# Patient Record
Sex: Male | Born: 1980
Health system: Southern US, Community
[De-identification: ages and names within clinical notes are randomized; demographics above are authoritative.]

## PROBLEM LIST (undated history)

## (undated) DIAGNOSIS — I4891 Unspecified atrial fibrillation: Secondary | ICD-10-CM

## (undated) DIAGNOSIS — R339 Retention of urine, unspecified: Secondary | ICD-10-CM

## (undated) HISTORY — DX: Unspecified atrial fibrillation: I48.91

## (undated) HISTORY — DX: Retention of urine, unspecified: R33.9

---

## 2003-05-21 ENCOUNTER — Encounter: Admission: RE | Admit: 2003-05-21 | Discharge: 2003-05-21 | Payer: Self-pay | Admitting: Internal Medicine

## 2004-03-16 ENCOUNTER — Encounter: Admission: RE | Admit: 2004-03-16 | Discharge: 2004-03-16 | Payer: Self-pay | Admitting: Internal Medicine

## 2005-08-19 ENCOUNTER — Ambulatory Visit: Payer: Self-pay | Admitting: Internal Medicine

## 2015-09-05 ENCOUNTER — Encounter (HOSPITAL_COMMUNITY): Payer: Self-pay | Admitting: *Deleted

## 2015-09-05 ENCOUNTER — Emergency Department (HOSPITAL_COMMUNITY)
Admission: EM | Admit: 2015-09-05 | Discharge: 2015-09-05 | Disposition: A | Discharge status: Home / Self Care | Payer: 59 | Attending: Emergency Medicine | Admitting: Emergency Medicine

## 2015-09-05 ENCOUNTER — Emergency Department (HOSPITAL_COMMUNITY): Payer: 59

## 2015-09-05 DIAGNOSIS — Y9289 Other specified places as the place of occurrence of the external cause: Secondary | ICD-10-CM | POA: Insufficient documentation

## 2015-09-05 DIAGNOSIS — S61231A Puncture wound without foreign body of left index finger without damage to nail, initial encounter: Secondary | ICD-10-CM | POA: Diagnosis not present

## 2015-09-05 DIAGNOSIS — Y9389 Activity, other specified: Secondary | ICD-10-CM | POA: Insufficient documentation

## 2015-09-05 DIAGNOSIS — W294XXA Contact with nail gun, initial encounter: Secondary | ICD-10-CM | POA: Insufficient documentation

## 2015-09-05 DIAGNOSIS — Y998 Other external cause status: Secondary | ICD-10-CM | POA: Diagnosis not present

## 2015-09-05 DIAGNOSIS — S61239A Puncture wound without foreign body of unspecified finger without damage to nail, initial encounter: Secondary | ICD-10-CM

## 2015-09-05 MED ORDER — IBUPROFEN 600 MG PO TABS: 600.0000 mg | ORAL_TABLET | Freq: Four times a day (QID) | ORAL | Status: DC | PRN

## 2015-09-05 MED ORDER — HYDROCODONE-ACETAMINOPHEN 5-325 MG PO TABS
1.0000 | ORAL_TABLET | Freq: Once | ORAL | Status: AC
  Administered 2015-09-05: 1 via ORAL
  Filled 2015-09-05: qty 1

## 2015-09-05 MED ORDER — CEPHALEXIN 500 MG PO CAPS: 500.0000 mg | ORAL_CAPSULE | Freq: Four times a day (QID) | ORAL | Status: DC

## 2015-09-05 NOTE — Discharge Instructions (Signed)
Ibuprofen or Tylenol for pain. AP finger elevated, ice several times a day. Put topical antibacterial ointment twice a day. Take Keflex as prescribed until all gone to prevent infection. Follow-up with a hand specialist.  Puncture Wound A puncture wound is an injury that is caused by a sharp, thin object that goes through (penetrates) your skin. Usually, a puncture wound does not leave a large opening in your skin, so it may not bleed a lot. However, when you get a puncture wound, dirt or other materials (foreign bodies) can be forced into your wound and break off inside. This increases the chance of infection, such as tetanus. CAUSES Puncture wounds are caused by any sharp, thin object that goes through your skin, such as:  Animal teeth, as with an animal bite.  Sharp, pointed objects, such as nails, splinters of glass, fishhooks, and needles. SYMPTOMS Symptoms of a puncture wound include:  Pain.  Bleeding.  Swelling.  Bruising.  Fluid leaking from the wound.  Numbness, tingling, or loss of function. DIAGNOSIS This condition is diagnosed with a medical history and physical exam. Your wound will be checked to see if it contains any foreign bodies. You may also have X-rays or other imaging tests. TREATMENT Treatment for a puncture wound depends on how serious the wound is. It also depends on whether the wound contains any foreign bodies. Treatment for all types of puncture wounds usually starts with:  Controlling the bleeding.  Washing out the wound with a germ-free (sterile) salt-water solution.  Checking the wound for foreign bodies. Treatment may also include:  Having the wound opened surgically to remove a foreign object.  Closing the wound with stitches (sutures) if it continues to bleed.  Covering the wound with antibiotic ointments and a bandage (dressing).  Receiving a tetanus shot.  Receiving a rabies vaccine. HOME CARE INSTRUCTIONS Medicines  Take or apply  over-the-counter and prescription medicines only as told by your health care provider.  If you were prescribed an antibiotic, take or apply it as told by your health care provider. Do not stop using the antibiotic even if your condition improves. Wound Care  There are many ways to close and cover a wound. For example, a wound can be covered with sutures, skin glue, or adhesive strips. Follow instructions from your health care provider about:  How to take care of your wound.  When and how you should change your dressing.  When you should remove your dressing.  Removing whatever was used to close your wound.  Keep the dressing dry as told by your health care provider. Do not take baths, swim, use a hot tub, or do anything that would put your wound underwater until your health care provider approves.  Clean the wound as told by your health care provider.  Do not scratch or pick at the wound.  Check your wound every day for signs of infection. Watch for:  Redness, swelling, or pain.  Fluid, blood, or pus. General Instructions  Raise (elevate) the injured area above the level of your heart while you are sitting or lying down.  If your puncture wound is in your foot, ask your health care provider if you need to avoid putting weight on your foot and for how long.  Keep all follow-up visits as told by your health care provider. This is important. SEEK MEDICAL CARE IF:  You received a tetanus shot and you have swelling, severe pain, redness, or bleeding at the injection site.  You have  a fever.  Your sutures come out.  You notice a bad smell coming from your wound or your dressing.  You notice something coming out of your wound, such as wood or glass.  Your pain is not controlled with medicine.  You have increased redness, swelling, or pain at the site of your wound.  You have fluid, blood, or pus coming from your wound.  You notice a change in the color of your skin near  your wound.  You need to change the dressing frequently due to fluid, blood, or pus draining from your wound.  You develop a new rash.  You develop numbness around your wound. SEEK IMMEDIATE MEDICAL CARE IF:  You develop severe swelling around your wound.  Your pain suddenly increases and is severe.  You develop painful skin lumps.  You have a red streak going away from your wound.  The wound is on your hand or foot and you cannot properly move a finger or toe.  The wound is on your hand or foot and you notice that your fingers or toes look pale or bluish.   This information is not intended to replace advice given to you by your health care provider. Make sure you discuss any questions you have with your health care provider.   Document Released: 07/27/2005 Document Revised: 07/08/2015 Document Reviewed: 12/10/2014 Elsevier Interactive Patient Education [employer].

## 2015-09-05 NOTE — ED Provider Notes (Signed)
CSN: [id number]     Arrival date & time 09/05/15  1545 History   First MD Initiated Contact with Patient 09/05/15 1611     Chief Complaint  Patient presents with  . Finger Injury     (Consider location/radiation/quality/duration/timing/severity/associated sxs/prior Treatment) HPI Carl Newton is a 34 y.o. male who presents to emergency department complaining of left finger injury. Patient states he put a nail through his left index finger. He states he pulled it out. He states nail went through and through. He denies any numbness or tingling distal to the injury. He denies any difficulty flexing or extending fingers. He states he is having pain in that finger. States pressure was applied for bleeding, and states finger continues to bleed some. States tetanus has been within the last 3 years. No other injuries.  History reviewed. No pertinent past medical history. History reviewed. No pertinent past surgical history. History reviewed. No pertinent family history. Social History  Substance Use Topics  . Smoking status: Never Smoker   . Smokeless tobacco: None  . Alcohol Use: No    Review of Systems  Musculoskeletal: Positive for arthralgias.  Skin: Positive for wound.  Neurological: Negative for weakness and numbness.  All other systems reviewed and are negative.     Allergies  Review of patient's allergies indicates no known allergies.  Home Medications   Prior to Admission medications   Not on File   BP 114/81 mmHg  Pulse 82  Temp(Src) 98 F (36.7 C) (Oral)  Resp 18  Ht 5\' 7"  (1.702 m)  Wt 151 lb (68.493 kg)  BMI 23.64 kg/m2  SpO2 100% Physical Exam  Constitutional: He is oriented to person, place, and time. He appears well-developed and well-nourished. No distress.  HENT:  Head: Normocephalic.  Eyes: Conjunctivae are normal.  Neck: Neck supple.  Musculoskeletal:  Puncture wound through left index finger over the distal phalanx, through and through,  entry wound over the palmar surface and exit wound over the dorsal surface just lateral to the fingernail. Tender to palpation. Full range of motion at all joints, with strength intact with flexion and extension in each joint against resistance  Neurological: He is alert and oriented to person, place, and time.  Nursing note and vitals reviewed.   ED Course  Procedures (including critical care time) Labs Review Labs Reviewed - No data to display  Imaging Review Dg Finger Index Left  09/05/2015  CLINICAL DATA:  Nail gun injury to distal phalanx of left second finger. EXAM: LEFT INDEX FINGER 2+V COMPARISON:  None. FINDINGS: There are clustered tiny densities within the volar soft tissues adjacent to the ulnar aspect of the distal tuft of the distal phalanx in the left second finger, with surrounding soft tissue swelling. Otherwise no focal bony abnormality. No dislocation. IMPRESSION: Clustered tiny densities within the volar soft tissues adjacent to the ulnar aspect of the distal tuft of the distal phalanx in the left second finger with surrounding soft tissue swelling, favor tiny nondisplaced fracture fragments from the distal tuft, cannot exclude tiny radiopaque foreign bodies. Electronically Signed   By: Ilona Sorrel M.D.   On: 09/05/2015 17:15   I have personally reviewed and evaluated these images and lab results as part of my medical decision-making.   EKG Interpretation None      MDM   Final diagnoses:  Puncture wound of finger, initial encounter    patient with puncture wound from a nail. X-ray showing clustered tiny densities adjacent to the  distal tuft of the phalanx, most likely nondisplaced fracture fragments from the nail. Thoroughly irrigated and soaked in saline with iodine. Bacitracin and pressure dressing applied. Home with ice elevation, Keflex, follow-up with hand surgeon as needed.  Filed Vitals:   09/05/15 1604  BP: 114/81  Pulse: 82  Temp: 98 F (36.7 C)   TempSrc: Oral  Resp: 18  Height: 5\' 7"  (1.702 m)  Weight: 151 lb (68.493 kg)  SpO2: 100%     Jeannett Senior, PA-C 09/05/15 2139  Blanchie Dessert, MD 09/06/15 [phone]

## 2015-09-05 NOTE — ED Notes (Signed)
Pt reports shootting nail gun into left index finger. Nail removed pta.

## 2015-11-26 DIAGNOSIS — Z0000 Encounter for general adult medical examination without abnormal findings: Secondary | ICD-10-CM | POA: Diagnosis not present

## 2015-12-01 DIAGNOSIS — Z0000 Encounter for general adult medical examination without abnormal findings: Secondary | ICD-10-CM | POA: Diagnosis not present

## 2016-02-23 DIAGNOSIS — H52222 Regular astigmatism, left eye: Secondary | ICD-10-CM | POA: Diagnosis not present

## 2016-02-23 DIAGNOSIS — H5213 Myopia, bilateral: Secondary | ICD-10-CM | POA: Diagnosis not present

## 2016-06-14 DIAGNOSIS — N399 Disorder of urinary system, unspecified: Secondary | ICD-10-CM | POA: Diagnosis not present

## 2016-08-12 DIAGNOSIS — R3912 Poor urinary stream: Secondary | ICD-10-CM | POA: Diagnosis not present

## 2016-08-12 DIAGNOSIS — R3911 Hesitancy of micturition: Secondary | ICD-10-CM | POA: Diagnosis not present

## 2016-08-12 DIAGNOSIS — R278 Other lack of coordination: Secondary | ICD-10-CM | POA: Diagnosis not present

## 2016-08-16 MED FILL — TAMSULOSIN HCL 0.4 MG CAP: 0.4 | 30 days supply | Qty: 30 | Fill #0

## 2016-10-11 DIAGNOSIS — R3911 Hesitancy of micturition: Secondary | ICD-10-CM | POA: Diagnosis not present

## 2016-11-23 DIAGNOSIS — R278 Other lack of coordination: Secondary | ICD-10-CM | POA: Diagnosis not present

## 2016-11-23 DIAGNOSIS — M6281 Muscle weakness (generalized): Secondary | ICD-10-CM | POA: Diagnosis not present

## 2016-11-23 DIAGNOSIS — R3912 Poor urinary stream: Secondary | ICD-10-CM | POA: Diagnosis not present

## 2016-11-23 DIAGNOSIS — R3911 Hesitancy of micturition: Secondary | ICD-10-CM | POA: Diagnosis not present

## 2016-11-23 DIAGNOSIS — M545 Low back pain: Secondary | ICD-10-CM | POA: Diagnosis not present

## 2016-11-23 DIAGNOSIS — M62838 Other muscle spasm: Secondary | ICD-10-CM | POA: Diagnosis not present

## 2016-12-01 DIAGNOSIS — R3912 Poor urinary stream: Secondary | ICD-10-CM | POA: Diagnosis not present

## 2016-12-01 DIAGNOSIS — M545 Low back pain: Secondary | ICD-10-CM | POA: Diagnosis not present

## 2016-12-01 DIAGNOSIS — M62838 Other muscle spasm: Secondary | ICD-10-CM | POA: Diagnosis not present

## 2016-12-01 DIAGNOSIS — R3911 Hesitancy of micturition: Secondary | ICD-10-CM | POA: Diagnosis not present

## 2016-12-01 DIAGNOSIS — M6281 Muscle weakness (generalized): Secondary | ICD-10-CM | POA: Diagnosis not present

## 2016-12-14 DIAGNOSIS — M545 Low back pain: Secondary | ICD-10-CM | POA: Diagnosis not present

## 2016-12-14 DIAGNOSIS — M62838 Other muscle spasm: Secondary | ICD-10-CM | POA: Diagnosis not present

## 2016-12-14 DIAGNOSIS — R3911 Hesitancy of micturition: Secondary | ICD-10-CM | POA: Diagnosis not present

## 2016-12-14 DIAGNOSIS — N3943 Post-void dribbling: Secondary | ICD-10-CM | POA: Diagnosis not present

## 2016-12-14 DIAGNOSIS — M6281 Muscle weakness (generalized): Secondary | ICD-10-CM | POA: Diagnosis not present

## 2016-12-28 DIAGNOSIS — R3911 Hesitancy of micturition: Secondary | ICD-10-CM | POA: Diagnosis not present

## 2016-12-28 DIAGNOSIS — M62838 Other muscle spasm: Secondary | ICD-10-CM | POA: Diagnosis not present

## 2016-12-28 DIAGNOSIS — M6281 Muscle weakness (generalized): Secondary | ICD-10-CM | POA: Diagnosis not present

## 2016-12-28 DIAGNOSIS — N3943 Post-void dribbling: Secondary | ICD-10-CM | POA: Diagnosis not present

## 2016-12-28 DIAGNOSIS — R3912 Poor urinary stream: Secondary | ICD-10-CM | POA: Diagnosis not present

## 2017-01-11 DIAGNOSIS — R3912 Poor urinary stream: Secondary | ICD-10-CM | POA: Diagnosis not present

## 2017-01-11 DIAGNOSIS — R3911 Hesitancy of micturition: Secondary | ICD-10-CM | POA: Diagnosis not present

## 2017-01-13 DIAGNOSIS — R3911 Hesitancy of micturition: Secondary | ICD-10-CM | POA: Diagnosis not present

## 2017-01-13 DIAGNOSIS — N3943 Post-void dribbling: Secondary | ICD-10-CM | POA: Diagnosis not present

## 2017-01-13 DIAGNOSIS — R3912 Poor urinary stream: Secondary | ICD-10-CM | POA: Diagnosis not present

## 2017-01-13 DIAGNOSIS — M62838 Other muscle spasm: Secondary | ICD-10-CM | POA: Diagnosis not present

## 2017-01-13 DIAGNOSIS — M6281 Muscle weakness (generalized): Secondary | ICD-10-CM | POA: Diagnosis not present

## 2017-01-25 DIAGNOSIS — M6281 Muscle weakness (generalized): Secondary | ICD-10-CM | POA: Diagnosis not present

## 2017-01-25 DIAGNOSIS — M62838 Other muscle spasm: Secondary | ICD-10-CM | POA: Diagnosis not present

## 2017-01-25 DIAGNOSIS — N3943 Post-void dribbling: Secondary | ICD-10-CM | POA: Diagnosis not present

## 2017-01-25 DIAGNOSIS — R3912 Poor urinary stream: Secondary | ICD-10-CM | POA: Diagnosis not present

## 2017-01-25 DIAGNOSIS — M545 Low back pain: Secondary | ICD-10-CM | POA: Diagnosis not present

## 2017-01-25 DIAGNOSIS — R3911 Hesitancy of micturition: Secondary | ICD-10-CM | POA: Diagnosis not present

## 2017-02-07 MED FILL — TAMSULOSIN HCL 0.4 MG CAP: 0.4 | 30 days supply | Qty: 30 | Fill #1

## 2017-02-08 DIAGNOSIS — M545 Low back pain: Secondary | ICD-10-CM | POA: Diagnosis not present

## 2017-02-08 DIAGNOSIS — M6281 Muscle weakness (generalized): Secondary | ICD-10-CM | POA: Diagnosis not present

## 2017-02-08 DIAGNOSIS — N3943 Post-void dribbling: Secondary | ICD-10-CM | POA: Diagnosis not present

## 2017-02-08 DIAGNOSIS — R3912 Poor urinary stream: Secondary | ICD-10-CM | POA: Diagnosis not present

## 2017-02-08 DIAGNOSIS — R3911 Hesitancy of micturition: Secondary | ICD-10-CM | POA: Diagnosis not present

## 2017-02-08 DIAGNOSIS — M62838 Other muscle spasm: Secondary | ICD-10-CM | POA: Diagnosis not present

## 2017-04-20 MED FILL — TAMSULOSIN HCL 0.4 MG CAP: 0.4 | 90 days supply | Qty: 90 | Fill #2

## 2017-04-25 DIAGNOSIS — R3912 Poor urinary stream: Secondary | ICD-10-CM | POA: Diagnosis not present

## 2017-04-25 DIAGNOSIS — R3911 Hesitancy of micturition: Secondary | ICD-10-CM | POA: Diagnosis not present

## 2017-09-18 ENCOUNTER — Encounter: Payer: Self-pay | Admitting: Family Medicine

## 2017-09-18 ENCOUNTER — Ambulatory Visit (INDEPENDENT_AMBULATORY_CARE_PROVIDER_SITE_OTHER): Payer: 59 | Admitting: Family Medicine

## 2017-09-18 VITALS — BP 114/60 | HR 62 | Temp 98.0°F | Wt 150.0 lb

## 2017-09-18 DIAGNOSIS — Z0000 Encounter for general adult medical examination without abnormal findings: Secondary | ICD-10-CM | POA: Diagnosis not present

## 2017-09-18 DIAGNOSIS — Z7689 Persons encountering health services in other specified circumstances: Secondary | ICD-10-CM

## 2017-09-18 NOTE — Progress Notes (Signed)
Subjective:    Patient ID: Carl Newton, male    DOB: [birth year], 36 y.o.   MRN: [id number]  HPI  Patient is a very pleasant 36 year old Caucasian male here today to establish care.  He has no specific concerns today.  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  They are currently managing him with Flomax.  He is seen very little benefit from the Flomax and is interested in other options for treatment.  He reports that for several years, whenever he tries to urinate, he will have to wait 30-60 seconds before urine will start to flow.  At times is even worse.  He denies any dysuria or hematuria or urgency.  He denies any incontinence.  Doctor has checked his prostate.  Otherwise he has no concerns.  Flu shot is up-to-date Past Medical History:  Diagnosis Date  . Urinary retention    No past surgical history on file. Med list includes Flomax 0.4 mg p.o. nightly and Claritin 10 mg p.o. Daily No Known Allergies Social History   Socioeconomic History  . Marital status: Married    Spouse name: Not on file  . Number of children: Not on file  . Years of education: Not on file  . Highest education level: Not on file  Social Needs  . Financial resource strain: Not on file  . Food insecurity - worry: Not on file  . Food insecurity - inability: Not on file  . Transportation needs - medical: Not on file  . Transportation needs - non-medical: Not on file  Occupational History  . Not on file  Tobacco Use  . Smoking status: Never Smoker  Substance and Sexual Activity  . Alcohol use: No  . Drug use: No  . Sexual activity: Not on file  Other Topics Concern  . Not on file  Social History Narrative  . Not on file   Patient is married with 2 children.  He works as a Software engineer. Family History  Problem Relation Age of Onset  . Mental illness Father    . Cancer Father        prostate  . Heart disease Maternal Uncle   . Heart disease Maternal Grandmother   . Heart disease Maternal Grandfather      Review of Systems  All other systems reviewed and are negative.      Objective:   Physical Exam  Constitutional: He is oriented to person, place, and time. He appears well-developed and well-nourished. No distress.  HENT:  Head: Normocephalic and atraumatic.  Right Ear: External ear normal.  Left Ear: External ear normal.  Nose: Nose normal.  Mouth/Throat: Oropharynx is clear and moist. No oropharyngeal exudate.  Eyes: Conjunctivae and EOM are normal. Pupils are equal, round, and reactive to light. Right eye exhibits no discharge. Left eye exhibits no discharge.  Neck: Normal range of motion. Neck supple. No JVD present. No tracheal deviation present. No thyromegaly present.  Cardiovascular: Normal rate, regular rhythm, normal heart sounds and intact distal pulses. Exam reveals no gallop and no friction rub.  No murmur heard. Pulmonary/Chest: Effort normal and breath sounds normal. No stridor. No respiratory distress. He has no wheezes. He has no rales. He exhibits no tenderness.  Abdominal: Soft. Bowel sounds are normal. He exhibits no distension and no mass. There is no tenderness. There is no rebound and no  guarding.  Musculoskeletal: Normal range of motion. He exhibits no edema, tenderness or deformity.  Lymphadenopathy:    He has no cervical adenopathy.  Neurological: He is alert and oriented to person, place, and time. He has normal reflexes. He displays normal reflexes. No cranial nerve deficit. He exhibits normal muscle tone. Coordination normal.  Skin: Skin is warm. No rash noted. He is not diaphoretic. No erythema. No pallor.  Psychiatric: He has a normal mood and affect. His behavior is normal. Judgment and thought content normal.  Vitals reviewed.         Assessment & Plan:  Encounter to establish care with new  doctor  Physical exam today is completely normal.  I recommended a flu shot for the patient's flu shot is up-to-date.  Cancer screening is not yet due.  Given his family history of premature cardiovascular disease in his maternal uncle and in his maternal grandmother, I have recommended a lipomed at his next fasting lab work.

## 2017-10-30 DIAGNOSIS — H5213 Myopia, bilateral: Secondary | ICD-10-CM | POA: Diagnosis not present

## 2017-10-30 DIAGNOSIS — H52222 Regular astigmatism, left eye: Secondary | ICD-10-CM | POA: Diagnosis not present

## 2017-11-15 ENCOUNTER — Other Ambulatory Visit: Payer: No Typology Code available for payment source

## 2017-11-15 DIAGNOSIS — Z0000 Encounter for general adult medical examination without abnormal findings: Secondary | ICD-10-CM

## 2017-11-16 NOTE — Addendum Note (Signed)
Addended by: Jenna Luo T on: 11/16/2017 06:43 AM   Modules accepted: Level of Service

## 2017-11-22 LAB — CARDIO IQ(R) ADVANCED LIPID PANEL
APOLIPOPROTEIN (CARDIO IQ ADV LIPID PANEL): 91 mg/dL (ref 52–109)
CHOL/HDL RATIO: 2.7 calc (ref <5.0)
CHOLESTEROL: 211 mg/dL — AB (ref <200)
HDL: 78 mg/dL (ref >40)
LDL Cholesterol (Calc): 117 mg/dL — ABNORMAL HIGH (ref <100)
LDL LARGE: 6720 nmol/L (ref 3382–9376)
LDL Medium: 260 nmol/L (ref 122–498)
LDL PEAK SIZE: 226.1 Angstrom (ref >=217)
LDL Particle Number: 1524 nmol/L (ref 732–2035)
LDL Small: 184 nmol/L (ref 85–473)
NON-HDL CHOLESTEROL (CALC): 133 mg/dL — AB (ref <130)
TRIGLYCERIDES: 67 mg/dL (ref <150)

## 2017-11-22 LAB — CBC WITH DIFFERENTIAL/PLATELET
BASOS PCT: 0.4 %
Basophils Absolute: 22 cells/uL (ref 0–200)
EOS ABS: 94 {cells}/uL (ref 15–500)
Eosinophils Relative: 1.7 %
HEMATOCRIT: 43 % (ref 38.5–50.0)
Hemoglobin: 14.7 g/dL (ref 13.2–17.1)
LYMPHS ABS: 1859 {cells}/uL (ref 850–3900)
MCH: 29.7 pg (ref 27.0–33.0)
MCHC: 34.2 g/dL (ref 32.0–36.0)
MCV: 86.9 fL (ref 80.0–100.0)
MPV: 9.5 fL (ref 7.5–12.5)
Monocytes Relative: 7.9 %
Neutro Abs: 3091 cells/uL (ref 1500–7800)
Neutrophils Relative %: 56.2 %
Platelets: 230 10*3/uL (ref 140–400)
RBC: 4.95 10*6/uL (ref 4.20–5.80)
RDW: 12.3 % (ref 11.0–15.0)
Total Lymphocyte: 33.8 %
WBC: 5.5 10*3/uL (ref 3.8–10.8)
WBCMIX: 435 {cells}/uL (ref 200–950)

## 2017-11-22 LAB — COMPREHENSIVE METABOLIC PANEL
AG RATIO: 1.5 (calc) (ref 1.0–2.5)
ALKALINE PHOSPHATASE (APISO): 57 U/L (ref 40–115)
ALT: 16 U/L (ref 9–46)
AST: 16 U/L (ref 10–40)
Albumin: 4.4 g/dL (ref 3.6–5.1)
BUN: 17 mg/dL (ref 7–25)
CHLORIDE: 102 mmol/L (ref 98–110)
CO2: 25 mmol/L (ref 20–32)
Calcium: 9.4 mg/dL (ref 8.6–10.3)
Creat: 1.11 mg/dL (ref 0.60–1.35)
GLOBULIN: 3 g/dL (ref 1.9–3.7)
Glucose, Bld: 85 mg/dL (ref 65–99)
Potassium: 4.6 mmol/L (ref 3.5–5.3)
Sodium: 138 mmol/L (ref 135–146)
Total Bilirubin: 0.4 mg/dL (ref 0.2–1.2)
Total Protein: 7.4 g/dL (ref 6.1–8.1)

## 2017-11-24 ENCOUNTER — Encounter: Payer: Self-pay | Admitting: Family Medicine

## 2017-12-01 MED FILL — TAMSULOSIN HCL 0.4 MG CAP: 0.4 | 90 days supply | Qty: 180 | Fill #0

## 2018-05-22 MED FILL — CLINDAMYCIN HCL 150 MG CAPS: 150 | 7 days supply | Qty: 28 | Fill #0

## 2018-05-22 MED FILL — HYDROCODON-APAP 5-325: 5-325 | 4 days supply | Qty: 20 | Fill #0

## 2018-06-04 MED FILL — ETODOLAC 400 MG TABLET: 400 | 4 days supply | Qty: 12 | Fill #0

## 2019-03-07 ENCOUNTER — Ambulatory Visit (INDEPENDENT_AMBULATORY_CARE_PROVIDER_SITE_OTHER): Payer: No Typology Code available for payment source | Admitting: Family Medicine

## 2019-03-07 ENCOUNTER — Encounter: Payer: Self-pay | Admitting: Family Medicine

## 2019-03-07 ENCOUNTER — Other Ambulatory Visit: Payer: Self-pay

## 2019-03-07 VITALS — BP 112/70 | HR 68 | Temp 98.4°F | Resp 14 | Ht 68.0 in | Wt 160.0 lb

## 2019-03-07 DIAGNOSIS — R339 Retention of urine, unspecified: Secondary | ICD-10-CM | POA: Diagnosis not present

## 2019-03-07 DIAGNOSIS — R109 Unspecified abdominal pain: Secondary | ICD-10-CM

## 2019-03-07 NOTE — Progress Notes (Signed)
Subjective:    Patient ID: Carl Newton, male    DOB: 03-29-1981, 38 y.o.   MRN: [id number]  HPI Over the last week, the patient reports a "twitching" sensation in his abdomen just to the left and inferior to his umbilicus.  There is no exacerbating or alleviating event.  It is a pressure-like pain that will come and go for a split second.  It feels like a twitching sensation similar to a muscle fasciculation.  Is not brought on by movement such as flexion or extension or twisting.  Is not brought on by food.  He has not been constipated.  He denies any diarrhea.  He denies any melena or hematochezia.  He denies any hematuria or dysuria.  He denies any radiation of the pain into his back or into his testicles.  He denies any fevers or chills or nausea or vomiting.  He has not had the pain anymore today although it did occur quite regularly for the last 2 or 3 days prior.  He is also requesting a repeat referral to a urologist for his urinary retention.  He previously saw [facility] in the past and would like to see them again as a second opinion. Past Medical History:  Diagnosis Date  . Urinary retention    No past surgical history on file. Current Outpatient Medications on File Prior to Visit  Medication Sig Dispense Refill  . cephALEXin (KEFLEX) 500 MG capsule Take 1 capsule (500 mg total) by mouth 4 (four) times daily. 20 capsule 0  . ibuprofen (ADVIL,MOTRIN) 600 MG tablet Take 1 tablet (600 mg total) by mouth every 6 (six) hours as needed. 30 tablet 0   No current facility-administered medications on file prior to visit.    Social History   Socioeconomic History  . Marital status: Married    Spouse name: Not on file  . Number of children: Not on file  . Years of education: Not on file  . Highest education level: Not on file  Occupational History  . Not on file  Social Needs  . Financial resource strain: Not on file  . Food insecurity:    Worry: Not on file   Inability: Not on file  . Transportation needs:    Medical: Not on file    Non-medical: Not on file  Tobacco Use  . Smoking status: Never Smoker  Substance and Sexual Activity  . Alcohol use: No  . Drug use: No  . Sexual activity: Not on file  Lifestyle  . Physical activity:    Days per week: Not on file    Minutes per session: Not on file  . Stress: Not on file  Relationships  . Social connections:    Talks on phone: Not on file    Gets together: Not on file    Attends religious service: Not on file    Active member of club or organization: Not on file    Attends meetings of clubs or organizations: Not on file    Relationship status: Not on file  . Intimate partner violence:    Fear of current or ex partner: Not on file    Emotionally abused: Not on file    Physically abused: Not on file    Forced sexual activity: Not on file  Other Topics Concern  . Not on file  Social History Narrative  . Not on file   Family History  Problem Relation Age of Onset  . Mental illness Father   .  Cancer Father        prostate  . Heart disease Maternal Uncle   . Heart disease Maternal Grandmother   . Heart disease Maternal Grandfather       Review of Systems     Objective:   Physical Exam Constitutional:      General: He is not in acute distress.    Appearance: Normal appearance. He is normal weight. He is not ill-appearing, toxic-appearing or diaphoretic.  Cardiovascular:     Rate and Rhythm: Normal rate and regular rhythm.     Pulses: Normal pulses.     Heart sounds: Normal heart sounds.  Pulmonary:     Effort: Pulmonary effort is normal. No respiratory distress.     Breath sounds: Normal breath sounds. No stridor. No wheezing, rhonchi or rales.  Chest:     Chest wall: No tenderness.  Abdominal:     General: Bowel sounds are normal. There is no distension.     Palpations: Abdomen is soft. There is no mass.     Tenderness: There is no abdominal tenderness. There is no  right CVA tenderness, left CVA tenderness, guarding or rebound.     Hernia: No hernia is present.  Neurological:     Mental Status: He is alert.           Assessment & Plan:  Abdominal spasm I explained to the patient that I am uncertain as to the cause of his symptoms.  I do not see any evidence of a serious life-threatening illness or injury that is causing his symptoms.  His differential diagnosis includes muscle fasciculations in the deep abdominal muscle wall, faint intestinal spasms, or possibly small nephrolithiasis passing into the bladder.  At the present time his symptoms have improved and have not been present today.  Therefore I recommended continued monitoring as I anticipate this is most likely muscular and self-limited and will resolve on its own over the next few days.  I will consult urology regarding his history of urinary retention and request Dr. McDiarmid.

## 2019-06-06 ENCOUNTER — Other Ambulatory Visit: Payer: Self-pay | Admitting: Family Medicine

## 2019-06-06 DIAGNOSIS — Z0000 Encounter for general adult medical examination without abnormal findings: Secondary | ICD-10-CM

## 2019-06-10 ENCOUNTER — Other Ambulatory Visit: Payer: Self-pay

## 2019-06-10 ENCOUNTER — Other Ambulatory Visit: Payer: No Typology Code available for payment source

## 2019-06-10 DIAGNOSIS — Z0000 Encounter for general adult medical examination without abnormal findings: Secondary | ICD-10-CM

## 2019-06-11 LAB — CBC WITH DIFFERENTIAL/PLATELET
Absolute Monocytes: 473 cells/uL (ref 200–950)
Basophils Absolute: 22 cells/uL (ref 0–200)
Basophils Relative: 0.4 %
Eosinophils Absolute: 171 cells/uL (ref 15–500)
Eosinophils Relative: 3.1 %
HCT: 40.2 % (ref 38.5–50.0)
Hemoglobin: 13.8 g/dL (ref 13.2–17.1)
Lymphs Abs: 2497 cells/uL (ref 850–3900)
MCH: 30.9 pg (ref 27.0–33.0)
MCHC: 34.3 g/dL (ref 32.0–36.0)
MCV: 89.9 fL (ref 80.0–100.0)
MPV: 9.9 fL (ref 7.5–12.5)
Monocytes Relative: 8.6 %
Neutro Abs: 2338 cells/uL (ref 1500–7800)
Neutrophils Relative %: 42.5 %
Platelets: 251 10*3/uL (ref 140–400)
RBC: 4.47 10*6/uL (ref 4.20–5.80)
RDW: 12.4 % (ref 11.0–15.0)
Total Lymphocyte: 45.4 %
WBC: 5.5 10*3/uL (ref 3.8–10.8)

## 2019-06-11 LAB — COMPREHENSIVE METABOLIC PANEL
AG Ratio: 1.4 (calc) (ref 1.0–2.5)
ALT: 13 U/L (ref 9–46)
AST: 21 U/L (ref 10–40)
Albumin: 4.2 g/dL (ref 3.6–5.1)
Alkaline phosphatase (APISO): 63 U/L (ref 36–130)
BUN: 21 mg/dL (ref 7–25)
CO2: 26 mmol/L (ref 20–32)
Calcium: 9.3 mg/dL (ref 8.6–10.3)
Chloride: 104 mmol/L (ref 98–110)
Creat: 1.16 mg/dL (ref 0.60–1.35)
Globulin: 3 g/dL (calc) (ref 1.9–3.7)
Glucose, Bld: 86 mg/dL (ref 65–99)
Potassium: 4.3 mmol/L (ref 3.5–5.3)
Sodium: 138 mmol/L (ref 135–146)
Total Bilirubin: 0.6 mg/dL (ref 0.2–1.2)
Total Protein: 7.2 g/dL (ref 6.1–8.1)

## 2019-06-11 LAB — LIPID PANEL
Cholesterol: 193 mg/dL (ref <200)
HDL: 58 mg/dL (ref >=40)
LDL Cholesterol (Calc): 118 mg/dL (calc) — ABNORMAL HIGH
Non-HDL Cholesterol (Calc): 135 mg/dL (calc) — ABNORMAL HIGH (ref <130)
Total CHOL/HDL Ratio: 3.3 (calc) (ref <5.0)
Triglycerides: 74 mg/dL (ref <150)

## 2019-06-14 ENCOUNTER — Other Ambulatory Visit: Payer: Self-pay

## 2019-06-17 ENCOUNTER — Ambulatory Visit (INDEPENDENT_AMBULATORY_CARE_PROVIDER_SITE_OTHER): Payer: No Typology Code available for payment source | Admitting: Family Medicine

## 2019-06-17 ENCOUNTER — Encounter: Payer: Self-pay | Admitting: Family Medicine

## 2019-06-17 ENCOUNTER — Other Ambulatory Visit: Payer: Self-pay

## 2019-06-17 VITALS — BP 130/84 | HR 62 | Temp 98.6°F | Resp 16 | Ht 68.0 in | Wt 162.0 lb

## 2019-06-17 DIAGNOSIS — Z0000 Encounter for general adult medical examination without abnormal findings: Secondary | ICD-10-CM

## 2019-06-17 NOTE — Progress Notes (Signed)
Subjective:    Patient ID: Carl Newton, male    DOB: [birth year], 38 y.o.   MRN: [id number]  HPI  Patient is a very pleasant 38 year old Caucasian male here today for complete physical exam.  Unfortunately since I last saw the patient, his father passed away.  His father was diagnosed with Alzheimer's disease and his dementia rapidly progressed to the point he was placed in the skilled nursing facility.  He contracted COVID while there and was ultimately in the hospital when he passed away.  The patient was unable to visit his father.  He is still grieving his father's loss as his father passed away less than 2 weeks ago.  He is concerned because Alzheimer's disease is strongly his father side of the family including his father, his uncle, his aunt.  All acquired memory problems in their 37s.  He continues to deal with urinary hesitancy particularly in public as he is unable to relax his bladder and it takes him 30 seconds or more to initiate a stream of urine.  He also feels that he has a weak urinary stream.  He is concerned that this may be a sign of MS. Past Medical History:  Diagnosis Date  . Urinary retention    No past surgical history on file.  No Known Allergies Social History   Socioeconomic History  . Marital status: Married    Spouse name: Not on file  . Number of children: Not on file  . Years of education: Not on file  . Highest education level: Not on file  Occupational History  . Not on file  Social Needs  . Financial resource strain: Not on file  . Food insecurity    Worry: Not on file    Inability: Not on file  . Transportation needs    Medical: Not on file    Non-medical: Not on file  Tobacco Use  . Smoking status: Never Smoker  . Smokeless tobacco: Never Used  Substance and Sexual Activity  . Alcohol use: No  . Drug use: No  . Sexual activity: Not on file  Lifestyle  . Physical activity    Days per week: Not on file    Minutes per session: Not  on file  . Stress: Not on file  Relationships  . Social Herbalist on phone: Not on file    Gets together: Not on file    Attends religious service: Not on file    Active member of club or organization: Not on file    Attends meetings of clubs or organizations: Not on file    Relationship status: Not on file  . Intimate partner violence    Fear of current or ex partner: Not on file    Emotionally abused: Not on file    Physically abused: Not on file    Forced sexual activity: Not on file  Other Topics Concern  . Not on file  Social History Narrative  . Not on file   Patient is married with 2 children.  He works as a Software engineer. Family History  Problem Relation Age of Onset  . Mental illness Father   . Cancer Father        prostate  . Heart disease Maternal Uncle   . Heart disease Maternal Grandmother   . Heart disease Maternal Grandfather      Review of Systems  All other systems reviewed and are negative.      Objective:  Physical Exam  Constitutional: He is oriented to person, place, and time. He appears well-developed and well-nourished. No distress.  HENT:  Head: Normocephalic and atraumatic.  Right Ear: External ear normal.  Left Ear: External ear normal.  Nose: Nose normal.  Mouth/Throat: Oropharynx is clear and moist. No oropharyngeal exudate.  Eyes: Pupils are equal, round, and reactive to light. Conjunctivae and EOM are normal. Right eye exhibits no discharge. Left eye exhibits no discharge.  Neck: Normal range of motion. Neck supple. No JVD present. No tracheal deviation present. No thyromegaly present.  Cardiovascular: Normal rate, regular rhythm, normal heart sounds and intact distal pulses. Exam reveals no gallop and no friction rub.  No murmur heard. Pulmonary/Chest: Effort normal and breath sounds normal. No stridor. No respiratory distress. He has no wheezes. He has no rales. He exhibits no tenderness.  Abdominal: Soft. Bowel sounds are  normal. He exhibits no distension and no mass. There is no abdominal tenderness. There is no rebound and no guarding.  Musculoskeletal: Normal range of motion.        General: No tenderness, deformity or edema.  Lymphadenopathy:    He has no cervical adenopathy.  Neurological: He is alert and oriented to person, place, and time. He has normal reflexes. No cranial nerve deficit. He exhibits normal muscle tone. Coordination normal.  Skin: Skin is warm. No rash noted. He is not diaphoretic. No erythema. No pallor.  Psychiatric: He has a normal mood and affect. His behavior is normal. Judgment and thought content normal.  Vitals reviewed.         Assessment & Plan:  The encounter diagnosis was General medical exam. Physical exam today is completely normal.  We discussed his urinary retention.  I question if the patient has some type of neurogenic bladder.  I recommended that he discuss with his urologist potentially trying bethanechol.  However I explained to the patient that this is well outside my experience.  Another option would be to consult neurology however the symptoms seem mild at the present time I believe the patient is willing simply to live with him.  I offered my condolences regarding the passing of his father.  His lab work was obtained which is completely normal and listed below: Appointment on 06/10/2019  Component Date Value Ref Range Status  . Cholesterol 06/10/2019 193  <200 mg/dL Final  . HDL 06/10/2019 58  > OR = 40 mg/dL Final  . Triglycerides 06/10/2019 74  <150 mg/dL Final  . LDL Cholesterol (Calc) 06/10/2019 118* mg/dL (calc) Final   Comment: Reference range: <100 . Desirable range <100 mg/dL for primary prevention;   <70 mg/dL for patients with CHD or diabetic patients  with > or = 2 CHD risk factors. Marland Kitchen LDL-C is now calculated using the Martin-Hopkins  calculation, which is a validated novel method providing  better accuracy than the Friedewald equation in the   estimation of LDL-C.  Cresenciano Genre et al. Annamaria Helling. 3149;702(63): 2061-2068  (http://education.[facility].com/faq/FAQ164)   . Total CHOL/HDL Ratio 06/10/2019 3.3  <5.0 (calc) Final  . Non-HDL Cholesterol (Calc) 06/10/2019 135* <130 mg/dL (calc) Final   Comment: For patients with diabetes plus 1 major ASCVD risk  factor, treating to a non-HDL-C goal of <100 mg/dL  (LDL-C of <70 mg/dL) is considered a therapeutic  option.   . Glucose, Bld 06/10/2019 86  65 - 99 mg/dL Final   Comment: .            Fasting reference interval .   .  BUN 06/10/2019 21  7 - 25 mg/dL Final  . Creat 06/10/2019 1.16  0.60 - 1.35 mg/dL Final  . BUN/Creatinine Ratio 05/69/7948 NOT APPLICABLE  6 - 22 (calc) Final  . Sodium 06/10/2019 138  135 - 146 mmol/L Final  . Potassium 06/10/2019 4.3  3.5 - 5.3 mmol/L Final  . Chloride 06/10/2019 104  98 - 110 mmol/L Final  . CO2 06/10/2019 26  20 - 32 mmol/L Final  . Calcium 06/10/2019 9.3  8.6 - 10.3 mg/dL Final  . Total Protein 06/10/2019 7.2  6.1 - 8.1 g/dL Final  . Albumin 06/10/2019 4.2  3.6 - 5.1 g/dL Final  . Globulin 06/10/2019 3.0  1.9 - 3.7 g/dL (calc) Final  . AG Ratio 06/10/2019 1.4  1.0 - 2.5 (calc) Final  . Total Bilirubin 06/10/2019 0.6  0.2 - 1.2 mg/dL Final  . Alkaline phosphatase (APISO) 06/10/2019 63  36 - 130 U/L Final  . AST 06/10/2019 21  10 - 40 U/L Final  . ALT 06/10/2019 13  9 - 46 U/L Final  . WBC 06/10/2019 5.5  3.8 - 10.8 Thousand/uL Final  . RBC 06/10/2019 4.47  4.20 - 5.80 Million/uL Final  . Hemoglobin 06/10/2019 13.8  13.2 - 17.1 g/dL Final  . HCT 06/10/2019 40.2  38.5 - 50.0 % Final  . MCV 06/10/2019 89.9  80.0 - 100.0 fL Final  . MCH 06/10/2019 30.9  27.0 - 33.0 pg Final  . MCHC 06/10/2019 34.3  32.0 - 36.0 g/dL Final  . RDW 06/10/2019 12.4  11.0 - 15.0 % Final  . Platelets 06/10/2019 251  140 - 400 Thousand/uL Final  . MPV 06/10/2019 9.9  7.5 - 12.5 fL Final  . Neutro Abs 06/10/2019 2,338  1,500 - 7,800 cells/uL Final  . Lymphs  Abs 06/10/2019 2,497  850 - 3,900 cells/uL Final  . Absolute Monocytes 06/10/2019 473  200 - 950 cells/uL Final  . Eosinophils Absolute 06/10/2019 171  15 - 500 cells/uL Final  . Basophils Absolute 06/10/2019 22  0 - 200 cells/uL Final  . Neutrophils Relative % 06/10/2019 42.5  % Final  . Total Lymphocyte 06/10/2019 45.4  % Final  . Monocytes Relative 06/10/2019 8.6  % Final  . Eosinophils Relative 06/10/2019 3.1  % Final  . Basophils Relative 06/10/2019 0.4  % Final

## 2019-06-28 MED FILL — ALFUZOSIN HCL ER 10 MG TAB: 10 | 30 days supply | Qty: 30 | Fill #0

## 2020-07-20 ENCOUNTER — Other Ambulatory Visit: Payer: Self-pay

## 2020-07-20 ENCOUNTER — Ambulatory Visit (INDEPENDENT_AMBULATORY_CARE_PROVIDER_SITE_OTHER): Payer: No Typology Code available for payment source | Admitting: Family Medicine

## 2020-07-20 ENCOUNTER — Encounter: Payer: Self-pay | Admitting: Family Medicine

## 2020-07-20 VITALS — BP 128/88 | HR 74 | Temp 97.6°F | Ht 67.0 in | Wt 152.0 lb

## 2020-07-20 DIAGNOSIS — Z0000 Encounter for general adult medical examination without abnormal findings: Secondary | ICD-10-CM | POA: Diagnosis not present

## 2020-07-20 NOTE — Progress Notes (Signed)
Subjective:    Patient ID: Carl Newton, male    DOB: [birth year], 39 y.o.   MRN: [id number]  HPI  Patient is a very pleasant 39 year old Caucasian male here today for complete physical exam.  Patient denies any medical concerns.  He is working mainly from home.  He occasionally goes into the office for projects.  He has 3 children.  They are all involved in a daycare at their local church.  He has received his Covid vaccination.  He is due for his flu shot but he would like to wait and get that in October at his job.  Otherwise he is doing well with no concerns Past Medical History:  Diagnosis Date  . Urinary retention    No past surgical history on file.  No Known Allergies Social History   Socioeconomic History  . Marital status: Married    Spouse name: Not on file  . Number of children: Not on file  . Years of education: Not on file  . Highest education level: Not on file  Occupational History  . Not on file  Tobacco Use  . Smoking status: Never Smoker  . Smokeless tobacco: Never Used  Substance and Sexual Activity  . Alcohol use: No  . Drug use: No  . Sexual activity: Not on file  Other Topics Concern  . Not on file  Social History Narrative  . Not on file   Social Determinants of Health   Financial Resource Strain:   . Difficulty of Paying Living Expenses: Not on file  Food Insecurity:   . Worried About Charity fundraiser in the Last Year: Not on file  . Ran Out of Food in the Last Year: Not on file  Transportation Needs:   . Lack of Transportation (Medical): Not on file  . Lack of Transportation (Non-Medical): Not on file  Physical Activity:   . Days of Exercise per Week: Not on file  . Minutes of Exercise per Session: Not on file  Stress:   . Feeling of Stress : Not on file  Social Connections:   . Frequency of Communication with Friends and Family: Not on file  . Frequency of Social Gatherings with Friends and Family: Not on file  . Attends  Religious Services: Not on file  . Active Member of Clubs or Organizations: Not on file  . Attends Archivist Meetings: Not on file  . Marital Status: Not on file  Intimate Partner Violence:   . Fear of Current or Ex-Partner: Not on file  . Emotionally Abused: Not on file  . Physically Abused: Not on file  . Sexually Abused: Not on file   Patient is married with 3 children.  He works as a Software engineer. Family History  Problem Relation Age of Onset  . Cancer Father        prostate  . Alzheimer's disease Father   . Heart disease Maternal Uncle   . Heart disease Maternal Grandmother   . Heart disease Maternal Grandfather   . Alzheimer's disease Paternal Aunt   . Alzheimer's disease Paternal Uncle      Review of Systems  All other systems reviewed and are negative.      Objective:   Physical Exam Vitals reviewed.  Constitutional:      General: He is not in acute distress.    Appearance: He is well-developed. He is not diaphoretic.  HENT:     Head: Normocephalic and atraumatic.  Right Ear: Tympanic membrane, ear canal and external ear normal.     Left Ear: Tympanic membrane, ear canal and external ear normal.     Nose: Nose normal. No congestion or rhinorrhea.     Mouth/Throat:     Pharynx: No oropharyngeal exudate.  Eyes:     General: No scleral icterus.       Right eye: No discharge.        Left eye: No discharge.     Extraocular Movements: Extraocular movements intact.     Conjunctiva/sclera: Conjunctivae normal.     Pupils: Pupils are equal, round, and reactive to light.  Neck:     Thyroid: No thyromegaly.     Vascular: No JVD.     Trachea: No tracheal deviation.  Cardiovascular:     Rate and Rhythm: Normal rate and regular rhythm.     Heart sounds: Normal heart sounds. No murmur heard.  No friction rub. No gallop.   Pulmonary:     Effort: Pulmonary effort is normal. No respiratory distress.     Breath sounds: Normal breath sounds. No stridor. No  wheezing or rales.  Chest:     Chest wall: No tenderness.  Abdominal:     General: Bowel sounds are normal. There is no distension.     Palpations: Abdomen is soft. There is no mass.     Tenderness: There is no abdominal tenderness. There is no guarding or rebound.  Genitourinary:    Penis: Normal.      Testes: Normal.  Musculoskeletal:        General: No tenderness or deformity. Normal range of motion.     Cervical back: Normal range of motion and neck supple.  Lymphadenopathy:     Cervical: No cervical adenopathy.  Skin:    General: Skin is warm.     Coloration: Skin is not pale.     Findings: No erythema or rash.  Neurological:     General: No focal deficit present.     Mental Status: He is alert and oriented to person, place, and time. Mental status is at baseline.     Cranial Nerves: No cranial nerve deficit.     Sensory: No sensory deficit.     Motor: No weakness or abnormal muscle tone.     Coordination: Coordination normal.     Gait: Gait normal.     Deep Tendon Reflexes: Reflexes are normal and symmetric. Reflexes normal.  Psychiatric:        Behavior: Behavior normal.        Thought Content: Thought content normal.        Judgment: Judgment normal.           Assessment & Plan:  General medical exam   Physical exam today is completely normal.  Patient denies any serious issues on his review of systems.  Recommended a flu shot but he would like to defer that to October.  He has had both doses of the Covid vaccine.  I recommended fasting lab work including a CBC, CMP, fasting lipid panel.  He has the schedule with his life insurance coming up and he states that he will get me a copy when they do it.  Otherwise regular anticipatory guidance is provided.  Patient declines hepatitis C and HIV screening.

## 2020-09-28 ENCOUNTER — Other Ambulatory Visit: Payer: Self-pay

## 2020-09-28 ENCOUNTER — Ambulatory Visit (INDEPENDENT_AMBULATORY_CARE_PROVIDER_SITE_OTHER): Payer: No Typology Code available for payment source | Admitting: Family Medicine

## 2020-09-28 VITALS — BP 130/80 | HR 70 | Temp 97.3°F | Ht 67.0 in | Wt 153.0 lb

## 2020-09-28 DIAGNOSIS — I499 Cardiac arrhythmia, unspecified: Secondary | ICD-10-CM | POA: Diagnosis not present

## 2020-09-28 NOTE — Progress Notes (Signed)
Subjective:    Patient ID: Carl Newton, male    DOB: 1981/09/01, 39 y.o.   MRN: [id number]  HPI  Patient is a very pleasant 39 year old Caucasian male presenting today complaining of an irregular heartbeat.  He states that he was then his normal state of health until last Wednesday.  He was playing tennis.  He played vigorously for approximately 1 hour.  Afterwards he felt his heart beating irregularly.  He states that it was beating 100-110 bpm for several hours even after he finished playing tennis.  He denied any shortness of breath or chest pain although he did have fatigue and poor appetite during this event.  He denies any syncope or near syncope.  However he states that he felt like his heart was beating abnormally.  He states that he felt like his heart was "beating deeper and stronger" than previously.  He used some some ice water on a rag and applied this to his face and that seemed to trigger the reaction to stop.  He denied any pleurisy or shortness of breath.  He does have a family history of cardiovascular disease in male relatives in their 22s.  His mother has a history of PVCs Past Medical History:  Diagnosis Date  . Urinary retention    No past surgical history on file.  No Known Allergies Social History   Socioeconomic History  . Marital status: Married    Spouse name: Not on file  . Number of children: Not on file  . Years of education: Not on file  . Highest education level: Not on file  Occupational History  . Not on file  Tobacco Use  . Smoking status: Never Smoker  . Smokeless tobacco: Never Used  Substance and Sexual Activity  . Alcohol use: No  . Drug use: No  . Sexual activity: Not on file  Other Topics Concern  . Not on file  Social History Narrative  . Not on file   Social Determinants of Health   Financial Resource Strain:   . Difficulty of Paying Living Expenses: Not on file  Food Insecurity:   . Worried About Charity fundraiser in  the Last Year: Not on file  . Ran Out of Food in the Last Year: Not on file  Transportation Needs:   . Lack of Transportation (Medical): Not on file  . Lack of Transportation (Non-Medical): Not on file  Physical Activity:   . Days of Exercise per Week: Not on file  . Minutes of Exercise per Session: Not on file  Stress:   . Feeling of Stress : Not on file  Social Connections:   . Frequency of Communication with Friends and Family: Not on file  . Frequency of Social Gatherings with Friends and Family: Not on file  . Attends Religious Services: Not on file  . Active Member of Clubs or Organizations: Not on file  . Attends Archivist Meetings: Not on file  . Marital Status: Not on file  Intimate Partner Violence:   . Fear of Current or Ex-Partner: Not on file  . Emotionally Abused: Not on file  . Physically Abused: Not on file  . Sexually Abused: Not on file   Patient is married with 3 children.  He works as a Software engineer. Family History  Problem Relation Age of Onset  . Cancer Father        prostate  . Alzheimer's disease Father   . Heart disease Maternal Uncle   .  Heart disease Maternal Grandmother   . Heart disease Maternal Grandfather   . Alzheimer's disease Paternal Aunt   . Alzheimer's disease Paternal Uncle      Review of Systems  All other systems reviewed and are negative.      Objective:   Physical Exam Vitals reviewed.  Constitutional:      General: He is not in acute distress.    Appearance: He is well-developed. He is not diaphoretic.  HENT:     Head: Normocephalic and atraumatic.     Right Ear: Tympanic membrane, ear canal and external ear normal.     Left Ear: Tympanic membrane, ear canal and external ear normal.     Nose: Nose normal. No congestion or rhinorrhea.     Mouth/Throat:     Pharynx: No oropharyngeal exudate.  Eyes:     General: No scleral icterus.       Right eye: No discharge.        Left eye: No discharge.     Extraocular  Movements: Extraocular movements intact.     Conjunctiva/sclera: Conjunctivae normal.     Pupils: Pupils are equal, round, and reactive to light.  Neck:     Thyroid: No thyromegaly.     Vascular: No JVD.     Trachea: No tracheal deviation.  Cardiovascular:     Rate and Rhythm: Normal rate and regular rhythm.     Heart sounds: Normal heart sounds. No murmur heard.  No friction rub. No gallop.   Pulmonary:     Effort: Pulmonary effort is normal. No respiratory distress.     Breath sounds: Normal breath sounds. No stridor. No wheezing or rales.  Chest:     Chest wall: No tenderness.  Abdominal:     General: Bowel sounds are normal. There is no distension.     Palpations: Abdomen is soft. There is no mass.     Tenderness: There is no abdominal tenderness. There is no guarding or rebound.  Genitourinary:    Penis: Normal.      Testes: Normal.  Musculoskeletal:        General: No tenderness or deformity. Normal range of motion.     Cervical back: Normal range of motion and neck supple.  Lymphadenopathy:     Cervical: No cervical adenopathy.  Skin:    General: Skin is warm.     Coloration: Skin is not pale.     Findings: No erythema or rash.  Neurological:     General: No focal deficit present.     Mental Status: He is alert and oriented to person, place, and time. Mental status is at baseline.     Cranial Nerves: No cranial nerve deficit.     Sensory: No sensory deficit.     Motor: No weakness or abnormal muscle tone.     Coordination: Coordination normal.     Gait: Gait normal.     Deep Tendon Reflexes: Reflexes are normal and symmetric.  Psychiatric:        Behavior: Behavior normal.        Thought Content: Thought content normal.        Judgment: Judgment normal.           Assessment & Plan:  Irregular heartbeat - Plan: EKG 12-Lead, CBC with Differential/Platelet, COMPLETE METABOLIC PANEL WITH GFR, Magnesium, Holter monitor - 24 hour  Physical exam today is  completely normal.  There is no murmur to suggest hypertrophic cardiomyopathy.  History is nonspecific however I  suspect that the patient may have been having PVCs or PACs.  Given the heart rate, I do not feel that this was SVT or ventricular tachycardia.  Also given the regularity, I do not feel that he was in atrial fibrillation.  EKG today is completely normal with normal intervals and normal axis and no evidence of ischemia or infarction.  I will obtain a CBC a CMP and a magnesium level to rule out electrolyte abnormalities that could trigger irregular heartbeats.  I will also schedule the patient for a 24-hour Holter monitor as well as an echocardiogram to evaluate for any structural abnormalities in the heart that can trigger an irregular heartbeat

## 2020-09-30 ENCOUNTER — Other Ambulatory Visit: Payer: Self-pay

## 2020-09-30 ENCOUNTER — Ambulatory Visit (HOSPITAL_COMMUNITY): Payer: No Typology Code available for payment source | Attending: Cardiovascular Disease

## 2020-09-30 DIAGNOSIS — I499 Cardiac arrhythmia, unspecified: Secondary | ICD-10-CM | POA: Insufficient documentation

## 2020-09-30 LAB — ECHOCARDIOGRAM COMPLETE
Area-P 1/2: 3.31 cm2
S' Lateral: 2.9 cm

## 2020-10-01 LAB — COMPLETE METABOLIC PANEL WITH GFR
AG Ratio: 1.5 (calc) (ref 1.0–2.5)
ALT: 16 U/L (ref 9–46)
AST: 18 U/L (ref 10–40)
Albumin: 4.7 g/dL (ref 3.6–5.1)
Alkaline phosphatase (APISO): 59 U/L (ref 36–130)
BUN: 16 mg/dL (ref 7–25)
CO2: 29 mmol/L (ref 20–32)
Calcium: 9.8 mg/dL (ref 8.6–10.3)
Chloride: 103 mmol/L (ref 98–110)
Creat: 1.08 mg/dL (ref 0.60–1.35)
GFR, Est African American: 100 mL/min/{1.73_m2} (ref >=60)
GFR, Est Non African American: 87 mL/min/{1.73_m2} (ref >=60)
Globulin: 3.1 g/dL (calc) (ref 1.9–3.7)
Glucose, Bld: 81 mg/dL (ref 65–99)
Potassium: 4.3 mmol/L (ref 3.5–5.3)
Sodium: 138 mmol/L (ref 135–146)
Total Bilirubin: 0.6 mg/dL (ref 0.2–1.2)
Total Protein: 7.8 g/dL (ref 6.1–8.1)

## 2020-10-01 LAB — CBC WITH DIFFERENTIAL/PLATELET
Absolute Monocytes: 410 cells/uL (ref 200–950)
Basophils Absolute: 32 cells/uL (ref 0–200)
Basophils Relative: 0.6 %
Eosinophils Absolute: 70 cells/uL (ref 15–500)
Eosinophils Relative: 1.3 %
HCT: 42 % (ref 38.5–50.0)
Hemoglobin: 14.1 g/dL (ref 13.2–17.1)
Lymphs Abs: 2106 cells/uL (ref 850–3900)
MCH: 30.3 pg (ref 27.0–33.0)
MCHC: 33.6 g/dL (ref 32.0–36.0)
MCV: 90.1 fL (ref 80.0–100.0)
MPV: 9.8 fL (ref 7.5–12.5)
Monocytes Relative: 7.6 %
Neutro Abs: 2781 cells/uL (ref 1500–7800)
Neutrophils Relative %: 51.5 %
Platelets: 259 10*3/uL (ref 140–400)
RBC: 4.66 10*6/uL (ref 4.20–5.80)
RDW: 12.3 % (ref 11.0–15.0)
Total Lymphocyte: 39 %
WBC: 5.4 10*3/uL (ref 3.8–10.8)

## 2020-10-01 LAB — TEST AUTHORIZATION

## 2020-10-01 LAB — MAGNESIUM: Magnesium: 2.3 mg/dL (ref 1.5–2.5)

## 2020-10-01 LAB — TSH: TSH: 1.02 mIU/L (ref 0.40–4.50)

## 2020-10-03 ENCOUNTER — Ambulatory Visit (INDEPENDENT_AMBULATORY_CARE_PROVIDER_SITE_OTHER): Payer: No Typology Code available for payment source

## 2020-10-03 DIAGNOSIS — I499 Cardiac arrhythmia, unspecified: Secondary | ICD-10-CM

## 2020-10-03 DIAGNOSIS — R002 Palpitations: Secondary | ICD-10-CM

## 2020-10-21 ENCOUNTER — Other Ambulatory Visit: Payer: Self-pay | Admitting: Family Medicine

## 2020-10-21 DIAGNOSIS — R002 Palpitations: Secondary | ICD-10-CM

## 2020-10-21 DIAGNOSIS — I499 Cardiac arrhythmia, unspecified: Secondary | ICD-10-CM

## 2021-03-19 ENCOUNTER — Telehealth: Payer: Self-pay | Admitting: Family Medicine

## 2021-03-19 NOTE — Telephone Encounter (Signed)
Pt called stating that he received a bill from [place]. I did get this info for you to help him with this.  DOS: 09/28/20 AMT OF BILL: 130.49 INVOICE: [id number] INSURANCE: [place]: [phone]

## 2021-04-13 NOTE — Telephone Encounter (Signed)
I have sent a message to [facility] [place] to get assistance with this bill.

## 2021-04-21 ENCOUNTER — Encounter: Payer: Self-pay | Admitting: Family Medicine

## 2021-04-22 ENCOUNTER — Other Ambulatory Visit: Payer: Self-pay | Admitting: Family Medicine

## 2021-04-22 DIAGNOSIS — I480 Paroxysmal atrial fibrillation: Secondary | ICD-10-CM

## 2021-04-23 ENCOUNTER — Telehealth: Payer: Self-pay | Admitting: Family Medicine

## 2021-04-23 NOTE — Telephone Encounter (Signed)
Received call from patient to discuss EOB he received from [facility]; bill received instructed patient to contact this office for the additional information needed (they are requesting medical records). Bill regarding TSH lab work. Patient will fax hard copy of EOB for [facility] to review.   Please advise at [phone].

## 2021-05-12 ENCOUNTER — Other Ambulatory Visit (HOSPITAL_COMMUNITY): Payer: No Typology Code available for payment source

## 2021-06-01 NOTE — Telephone Encounter (Signed)
[id number] - [place] - Carl Newton insurance denied the entire order because the insurance requested information and the patient did not contact the insurance to confirm or updated.   [id number] - [place] denied the entire order a duplicate please have patient contact insurance.   Per email from [place] he needs to contact his insurance.  I have spoken with patient and he was actually contacted last week and he believes this has been taken care of and he no longer has an outstanding balance.

## 2021-06-25 ENCOUNTER — Telehealth: Payer: Self-pay

## 2021-06-25 NOTE — Telephone Encounter (Signed)
Pt called in stating that he has received a bill from [place] that has went to collections, I did receive this info from pt.   DOS:09/28/2020 INVOICE NUM: [id number] INSURANCE: [place]: [phone]

## 2021-06-25 NOTE — Progress Notes (Addendum)
Cardiology Office Note:    Date:  06/29/2021   ID:  Carl Newton, DOB 06-05-1981, MRN [id number]  PCP:  Carl Frizzle, MD   [facility] Providers Cardiologist:  None     Referring MD: Carl Frizzle, MD    History of Present Illness:    Carl Newton is a 40 y.o. male here for the evaluation of atrial fibrillation per Dr. Dennard Newton. He last saw Dr. Dennard Newton 09/28/2020. On 04/21/2021 he contacted Dr. Dennard Newton and reported an episode of irregular heartbeat earlier that morning. First ECG from his iWatch showed him in atrial fibrillation, but another ECG repeated in 10 mins showed he was back in sinus rhythm. It was recommended that he start aspirin 325 mg daily, and he was scheduled for a cardiology consult.  He states that he first developed an episode the day before Thanksgiving in 2021.  He plays quite a bit of tennis and he felt tired afterwards.  No appetite.  Felt some mild nausea.  He felt out of rhythm.  His heart rate was noted to be in the 90s to 100s for many hours after his tennis match.  This is unusual for him.  He had another episode on June 22 which she recorded with his apple watch that shows atrial fibrillation heart rates in the 100-110 range.  He felt the palpitations.  This episode lasted only for about 20 minutes however.  Since these have started, he has noticed that his heart rate on his apple watch has been sometimes 1 9200 with playing vigorous tennis.  He does not feel short of breath with this but his heart rate just seems to be exceptionally fast.  He randomly will feel also his heart rate increased to the 130s when using stairs for instance.  As far as family history goes, there is no evidence of atrial fibrillation.  His maternal grandfather however did have an MI at age 57 and died, his uncle had heart attack at age 45.  They were both heavy smokers.  His mother has hyperlipidemia.  His LDL is 118 TSH is normal creatinine 1.08 liver  function normal.  Outside labs and notes reviewed.  Denies any fevers chills nausea vomiting syncope bleeding.  He did note that he has had some recent issues with erectile dysfunction, decreased morning erections.  Past Medical History:  Diagnosis Date   Urinary retention     History reviewed. No pertinent surgical history.  Current Medications: Current Meds  Medication Sig   diltiazem (CARDIZEM) 30 MG tablet Take 1 tablet (30 mg total) by mouth 4 (four) times daily.   loratadine (CLARITIN) 10 MG tablet Take 1 tablet by mouth every day as needed for allergies.   [DISCONTINUED] aspirin 325 MG EC tablet      Allergies:   Patient has no known allergies.   Social History   Socioeconomic History   Marital status: Married    Spouse name: Not on file   Number of children: Not on file   Years of education: Not on file   Highest education level: Not on file  Occupational History   Not on file  Tobacco Use   Smoking status: Never   Smokeless tobacco: Never  Substance and Sexual Activity   Alcohol use: No   Drug use: No   Sexual activity: Not on file  Other Topics Concern   Not on file  Social History Narrative   Not on file   Social Determinants of Health  Financial Resource Strain: Not on file  Food Insecurity: Not on file  Transportation Needs: Not on file  Physical Activity: Not on file  Stress: Not on file  Social Connections: Not on file     Family History: The patient's family history includes Alzheimer's disease in his father, paternal aunt, and paternal uncle; Cancer in his father; Heart disease in his maternal grandfather, maternal grandmother, and maternal uncle.  ROS:   Please see the history of present illness.    (+) All other systems reviewed and are negative.  EKGs/Labs/Other Studies Reviewed:    The following studies were reviewed today:   Monitor 10/21/2020: Normal sinus rhythm with range 44-182 bpm with Ave 72 bpm Isolated PAC's and  PVC's 4 beat SVT salvo No sypmtoms reported.  Echo 09/30/2020: 1. Left ventricular ejection fraction, by estimation, is 60 to 65%. The  left ventricle has normal function. The left ventricle has no regional  wall motion abnormalities. Left ventricular diastolic parameters were  normal.   2. Right ventricular systolic function is normal. The right ventricular  size is normal. Tricuspid regurgitation signal is inadequate for assessing  PA pressure.   3. The mitral valve is normal in structure. No evidence of mitral valve  regurgitation. No evidence of mitral stenosis.   4. The aortic valve is normal in structure. Aortic valve regurgitation is  not visualized. No aortic stenosis is present.   5. The inferior vena cava is normal in size with greater than 50%  respiratory variability, suggesting right atrial pressure of 3 mmHg.  EKG:  EKG is personally reviewed and interpreted. 06/29/21 - SB 58, SA EKG was noted on 09/28/2020 to be normal.  Normal sinus rhythm.  Normal intervals.  Recent Labs: 09/28/2020: ALT 16; BUN 16; Creat 1.08; Hemoglobin 14.1; Magnesium 2.3; Platelets 259; Potassium 4.3; Sodium 138; TSH 1.02  Recent Lipid Panel    Component Value Date/Time   CHOL 193 06/10/2019 0821   TRIG 74 06/10/2019 0821   HDL 58 06/10/2019 0821   CHOLHDL 3.3 06/10/2019 0821   LDLCALC 118 (H) 06/10/2019 0821     Risk Assessment/Calculations:   CHA2DS2-VASc Score = 0  The patient's score is based upon: CHF History: No HTN History: No Diabetes History: No Stroke History: No Vascular Disease History: No Age Score: 0 Gender Score: 0     ASSESSMENT AND PLAN: Paroxysmal Atrial Fibrillation (ICD10:  I48.0) The patient's CHA2DS2-VASc score is 0, indicating a 0.2% annual risk of stroke.        Carl Rumpf, MD    06/29/2021 5:39 PM          Physical Exam:    VS:  BP 130/80   Pulse (!) 58   Ht '5\' 7"'$  (1.702 m)   Wt 157 lb (71.2 kg)   SpO2 99%   BMI 24.59 kg/m      Wt Readings from Last 3 Encounters:  06/29/21 157 lb (71.2 kg)  09/28/20 153 lb (69.4 kg)  07/20/20 152 lb (68.9 kg)     GEN:  Well nourished, well developed in no acute distress HEENT: Normal NECK: No JVD; No carotid bruits LYMPHATICS: No lymphadenopathy CARDIAC: RRR, no murmurs, rubs, gallops RESPIRATORY:  Clear to auscultation without rales, wheezing or rhonchi  ABDOMEN: Soft, non-tender, non-distended MUSCULOSKELETAL:  No edema; No deformity  SKIN: Warm and dry NEUROLOGIC:  Alert and oriented x 3 PSYCHIATRIC:  Normal affect   ASSESSMENT:    1. Palpitations   2. Paroxysmal atrial fibrillation (  [place])    PLAN:    In order of problems listed above: Paroxysmal atrial fibrillation ([place]) Does not want anticoagulation in fact risks would outweigh benefits.  CHA2DS2-VASc score is 0.  Very rare episodes of atrial fibrillation noted.  His apple watch actually did a very good job of detecting his atrial fibrillation and printing out a fairly accurate ECG tracing. - We will go ahead and give him diltiazem short acting 30 mg once a day as needed every 4 hours to take if palpitations are present.  If episodes become more frequent, we will probably give him once a day diltiazem long-acting. - If symptomatic episodes still occur, we will have low threshold to have him discuss with electrophysiology for potential ablative efforts. Try to avoid excessive alcohol, no stimulants.  He does have an espresso machine at home, works from home, I would try to limit 1 cup of coffee daily. - Lab work was all unremarkable including TSH, electrolytes.        Follow-up: 6 months  Medication Adjustments/Labs and Tests Ordered: Current medicines are reviewed at length with the patient today.  Concerns regarding medicines are outlined above.  Orders Placed This Encounter  Procedures   EKG 12-Lead   Meds ordered this encounter  Medications   diltiazem (CARDIZEM) 30 MG tablet    Sig: Take 1 tablet  (30 mg total) by mouth 4 (four) times daily.    Dispense:  90 tablet    Refill:  3    Patient Instructions  Medication Instructions:  Please discontinue your Asprin. You may take Diltiazem 30 mg once every 4 hours as needed.  *If you need a refill on your cardiac medications before your next appointment, please call your pharmacy*  Follow-Up: At [facility], you and your health needs are our priority.  As part of our continuing mission to provide you with exceptional heart care, we have created designated Provider Care Teams.  These Care Teams include your primary Cardiologist (physician) and Advanced Practice Providers (APPs -  Physician Assistants and Nurse Practitioners) who all work together to provide you with the care you need, when you need it.  We recommend signing up for the patient portal called "MyChart".  Sign up information is provided on this After Visit Summary.  MyChart is used to connect with patients for Virtual Visits (Telemedicine).  Patients are able to view lab/test results, encounter notes, upcoming appointments, etc.  Non-urgent messages can be sent to your provider as well.   To learn more about what you can do with MyChart, go to [email or web address].    Your next appointment:   6 month(s)  The format for your next appointment:   In Person  Provider:   Candee Furbish, MD  Thank you for choosing [facility]!!      Signed, Candee Furbish, MD  06/29/2021 5:39 PM    [place]

## 2021-06-28 NOTE — Telephone Encounter (Signed)
I have sent this information to Viki at [place].

## 2021-06-29 ENCOUNTER — Other Ambulatory Visit (HOSPITAL_BASED_OUTPATIENT_CLINIC_OR_DEPARTMENT_OTHER): Payer: Self-pay

## 2021-06-29 ENCOUNTER — Other Ambulatory Visit: Payer: Self-pay

## 2021-06-29 ENCOUNTER — Encounter: Payer: Self-pay | Admitting: Cardiology

## 2021-06-29 ENCOUNTER — Ambulatory Visit (INDEPENDENT_AMBULATORY_CARE_PROVIDER_SITE_OTHER): Payer: No Typology Code available for payment source | Admitting: Cardiology

## 2021-06-29 VITALS — BP 130/80 | HR 58 | Ht 67.0 in | Wt 157.0 lb

## 2021-06-29 DIAGNOSIS — R002 Palpitations: Secondary | ICD-10-CM

## 2021-06-29 DIAGNOSIS — I480 Paroxysmal atrial fibrillation: Secondary | ICD-10-CM

## 2021-06-29 MED ORDER — DILTIAZEM HCL 30 MG PO TABS
30.0000 mg | ORAL_TABLET | Freq: Four times a day (QID) | ORAL | 3 refills | Status: DC
  Filled 2021-06-29: qty 90, 23d supply, fill #0

## 2021-06-29 NOTE — Patient Instructions (Signed)
Medication Instructions:  Please discontinue your Asprin. You may take Diltiazem 30 mg once every 4 hours as needed.  *If you need a refill on your cardiac medications before your next appointment, please call your pharmacy*  Follow-Up: At [facility], you and your health needs are our priority.  As part of our continuing mission to provide you with exceptional heart care, we have created designated Provider Care Teams.  These Care Teams include your primary Cardiologist (physician) and Advanced Practice Providers (APPs -  Physician Assistants and Nurse Practitioners) who all work together to provide you with the care you need, when you need it.  We recommend signing up for the patient portal called "MyChart".  Sign up information is provided on this After Visit Summary.  MyChart is used to connect with patients for Virtual Visits (Telemedicine).  Patients are able to view lab/test results, encounter notes, upcoming appointments, etc.  Non-urgent messages can be sent to your provider as well.   To learn more about what you can do with MyChart, go to [email or web address].    Your next appointment:   6 month(s)  The format for your next appointment:   In Person  Provider:   Candee Furbish, MD  Thank you for choosing [facility]!!

## 2021-06-29 NOTE — Assessment & Plan Note (Signed)
Does not want anticoagulation in fact risks would outweigh benefits.  CHA2DS2-VASc score is 0.  Very rare episodes of atrial fibrillation noted.  His apple watch actually did a very good job of detecting his atrial fibrillation and printing out a fairly accurate ECG tracing. - We will go ahead and give him diltiazem short acting 30 mg once a day as needed every 4 hours to take if palpitations are present.  If episodes become more frequent, we will probably give him once a day diltiazem long-acting. - If symptomatic episodes still occur, we will have low threshold to have him discuss with electrophysiology for potential ablative efforts. Try to avoid excessive alcohol, no stimulants.  He does have an espresso machine at home, works from home, I would try to limit 1 cup of coffee daily. - Lab work was all unremarkable including TSH, electrolytes.

## 2021-06-30 ENCOUNTER — Encounter (HOSPITAL_BASED_OUTPATIENT_CLINIC_OR_DEPARTMENT_OTHER): Payer: Self-pay | Admitting: Pharmacist

## 2021-06-30 ENCOUNTER — Other Ambulatory Visit (HOSPITAL_BASED_OUTPATIENT_CLINIC_OR_DEPARTMENT_OTHER): Payer: Self-pay

## 2021-07-01 ENCOUNTER — Other Ambulatory Visit (HOSPITAL_BASED_OUTPATIENT_CLINIC_OR_DEPARTMENT_OTHER): Payer: Self-pay

## 2021-07-01 NOTE — Telephone Encounter (Signed)
Patient called to speak with Larene [place]; wants to follow up on bill received from [place] currently in collections status. Please advise at [phone]

## 2021-07-07 ENCOUNTER — Other Ambulatory Visit (HOSPITAL_BASED_OUTPATIENT_CLINIC_OR_DEPARTMENT_OTHER): Payer: Self-pay

## 2021-07-13 NOTE — Telephone Encounter (Signed)
Patient called to follow up on bill sent to collections; awaiting communication from billing. Please advise at [phone].

## 2021-07-15 NOTE — Telephone Encounter (Signed)
Received follow up call from patient regarding unpaid [facility] bill for TSH, CPT code [phone], for DOS 11/29. Patient states bill hasn't been paid by his insurer [employer]; missing requested medical records (proof of services provided). Patient concerned about outstanding bill showing up on his credit report; bill almost one year old.   Spoke with Practice Ritta Slot; she will send a message to Jocelyn Lamer, who handles [facility] billing inquiries, for further assistance. Patient requesting call back asap. Please asvise at [phone].

## 2021-07-16 ENCOUNTER — Ambulatory Visit: Payer: No Typology Code available for payment source | Admitting: Family Medicine

## 2021-07-16 NOTE — Telephone Encounter (Signed)
I have sent email to [facility] at [facility] who should contact patient and follow up with [place] if additional information is needed.

## 2021-07-23 NOTE — Telephone Encounter (Signed)
Sent: Wednesday, July 21, 2021 2:40 PM To: Nonnie Done M [email or web address]> Subject: RE: Carl Newton     [id number] - [place]  The insurance carrier  is denied the entire order due to the requested information was not received.  The dx codes billed were I499, [id number].  My response same day to Carl Newton---  Carl Newton afternoon Carl Newton,     Per Karma Ganja he states that his insurance company is asking for medical records. I had already told him that at first they had denied the whole thing since they had requested information from him. Now he is saying that they got all of that straightened out and now they are asking for medical records. When did the insurance deny the claim again?     This is what he called and said on 07/15/2021  Received follow up call from patient regarding unpaid [facility] bill for TSH, CPT code 4443, for DOS 11/29. Patient states bill hasn't been paid by his insurer [employer]; missing requested medical records (proof of services provided). Patient concerned about outstanding bill showing up on his credit report; bill almost one year old.     Spoke with Practice Ritta Slot; she will send a message to Carl Newton, who handles [facility] billing inquiries, for further assistance. Patient requesting call back asap. Please advise at [phone].     I am sorry I just want to make sure this gets resolved for the patient and if it is truly something that he needs to take care of then he should know that and not be upset with [place] because it isn't being paid.   I'll check with billing   Thank you,  Carl Newton   Her response on same day ---

## 2021-07-23 NOTE — Telephone Encounter (Signed)
Patient called to follow up on billing issue; requesting to be notified of when he can expect a follow up call to address and resolve issue. Patient expressed concern about issues with his credit resulting from the length of time this billing issue has remained unresolved. Please advise at [phone].

## 2021-07-26 ENCOUNTER — Other Ambulatory Visit: Payer: Self-pay

## 2021-07-26 ENCOUNTER — Other Ambulatory Visit: Payer: No Typology Code available for payment source

## 2021-07-26 DIAGNOSIS — Z13220 Encounter for screening for lipoid disorders: Secondary | ICD-10-CM

## 2021-07-26 DIAGNOSIS — I480 Paroxysmal atrial fibrillation: Secondary | ICD-10-CM

## 2021-07-26 LAB — CBC WITH DIFFERENTIAL/PLATELET
Absolute Monocytes: 370 cells/uL (ref 200–950)
Basophils Absolute: 19 cells/uL (ref 0–200)
Basophils Relative: 0.4 %
Eosinophils Absolute: 168 cells/uL (ref 15–500)
Eosinophils Relative: 3.5 %
HCT: 41.2 % (ref 38.5–50.0)
Hemoglobin: 13.6 g/dL (ref 13.2–17.1)
Lymphs Abs: 2328 cells/uL (ref 850–3900)
MCH: 29.7 pg (ref 27.0–33.0)
MCHC: 33 g/dL (ref 32.0–36.0)
MCV: 90 fL (ref 80.0–100.0)
MPV: 9.7 fL (ref 7.5–12.5)
Monocytes Relative: 7.7 %
Neutro Abs: 1915 cells/uL (ref 1500–7800)
Neutrophils Relative %: 39.9 %
Platelets: 249 10*3/uL (ref 140–400)
RBC: 4.58 10*6/uL (ref 4.20–5.80)
RDW: 12.3 % (ref 11.0–15.0)
Total Lymphocyte: 48.5 %
WBC: 4.8 10*3/uL (ref 3.8–10.8)

## 2021-07-26 LAB — COMPLETE METABOLIC PANEL WITH GFR
AG Ratio: 1.3 (calc) (ref 1.0–2.5)
ALT: 14 U/L (ref 9–46)
AST: 26 U/L (ref 10–40)
Albumin: 4.1 g/dL (ref 3.6–5.1)
Alkaline phosphatase (APISO): 55 U/L (ref 36–130)
BUN: 16 mg/dL (ref 7–25)
CO2: 29 mmol/L (ref 20–32)
Calcium: 9.3 mg/dL (ref 8.6–10.3)
Chloride: 104 mmol/L (ref 98–110)
Creat: 1.24 mg/dL (ref 0.60–1.26)
Globulin: 3.2 g/dL (calc) (ref 1.9–3.7)
Glucose, Bld: 84 mg/dL (ref 65–99)
Potassium: 4.2 mmol/L (ref 3.5–5.3)
Sodium: 139 mmol/L (ref 135–146)
Total Bilirubin: 0.6 mg/dL (ref 0.2–1.2)
Total Protein: 7.3 g/dL (ref 6.1–8.1)
eGFR: 76 mL/min/{1.73_m2} (ref >=60)

## 2021-07-26 LAB — LIPID PANEL
Cholesterol: 181 mg/dL (ref <200)
HDL: 59 mg/dL (ref >=40)
LDL Cholesterol (Calc): 109 mg/dL (calc) — ABNORMAL HIGH
Non-HDL Cholesterol (Calc): 122 mg/dL (calc) (ref <130)
Total CHOL/HDL Ratio: 3.1 (calc) (ref <5.0)
Triglycerides: 43 mg/dL (ref <150)

## 2021-07-27 ENCOUNTER — Ambulatory Visit (INDEPENDENT_AMBULATORY_CARE_PROVIDER_SITE_OTHER): Payer: No Typology Code available for payment source | Admitting: Family Medicine

## 2021-07-27 ENCOUNTER — Encounter: Payer: Self-pay | Admitting: Family Medicine

## 2021-07-27 VITALS — BP 124/64 | HR 66 | Temp 98.6°F | Resp 14 | Ht 67.0 in | Wt 156.0 lb

## 2021-07-27 DIAGNOSIS — Z0000 Encounter for general adult medical examination without abnormal findings: Secondary | ICD-10-CM | POA: Diagnosis not present

## 2021-07-27 DIAGNOSIS — I480 Paroxysmal atrial fibrillation: Secondary | ICD-10-CM | POA: Diagnosis not present

## 2021-07-27 DIAGNOSIS — Z23 Encounter for immunization: Secondary | ICD-10-CM | POA: Diagnosis not present

## 2021-07-27 NOTE — Telephone Encounter (Signed)
Patient returned my call. I have given him the information below. I have sent additional question to Jocelyn Lamer at [facility] for patient waiting on her to respond.

## 2021-07-27 NOTE — Telephone Encounter (Signed)
  I have left message for patient so I can let him know the information I received from [place]. Please see below. I have also let him know that the front staff could give him an update as well if he calls and I am not here.   Sent: Monday, July 26, 2021 3:25:35 PM To: Nonnie Done M [email or web address]> Subject: RE: Carl Newton    [id number] - [place] - [place] We are resubmitting this claim with the new insurance information. Please advise patient to disregard invoice. Allow 30-45 days for a response.

## 2021-07-27 NOTE — Telephone Encounter (Signed)
ICD 10: R00.2 Z13.29

## 2021-07-27 NOTE — Progress Notes (Signed)
Subjective:    Patient ID: Carl Newton, male    DOB: July 05, 1981, 40 y.o.   MRN: [id number]  HPI  Patient is a very pleasant 40 year old Caucasian male here today for complete physical exam.  He is due for his flu shot today.  Otherwise he is doing well.  Since I last saw the patient, he was diagnosed with paroxysmal atrial fibrillation.  He is not even on an aspirin.  His chads score is so low that he does not benefit from anticoagulation.  I did recommend an aspirin a day as I feel the risk is relatively low.  He does have Cardizem to take as needed for an irregular heartbeat but he has not had to use it.  He does report a high resting heart rate.  He also states that when he exercises, his heart rate will hit 180 with minimal activity.  He feels like his exercising heart rate is higher than it should be.  He is not experiencing any chest pain or dyspnea on exertion or syncope or near syncope and he has had a normal echocardiogram last year however he states that he can easily have a heart rate greater than 180 with minimal activity.  We did discuss coronary calcium scores however I feel that he is a very low risk individual and at the present time I do not feel that a stress test is necessary.  His most recent lab work is listed below: Lab on 07/26/2021  Component Date Value Ref Range Status   WBC 07/26/2021 4.8  3.8 - 10.8 Thousand/uL Final   RBC 07/26/2021 4.58  4.20 - 5.80 Million/uL Final   Hemoglobin 07/26/2021 13.6  13.2 - 17.1 g/dL Final   HCT 07/26/2021 41.2  38.5 - 50.0 % Final   MCV 07/26/2021 90.0  80.0 - 100.0 fL Final   MCH 07/26/2021 29.7  27.0 - 33.0 pg Final   MCHC 07/26/2021 33.0  32.0 - 36.0 g/dL Final   RDW 07/26/2021 12.3  11.0 - 15.0 % Final   Platelets 07/26/2021 249  140 - 400 Thousand/uL Final   MPV 07/26/2021 9.7  7.5 - 12.5 fL Final   Neutro Abs 07/26/2021 1,915  1,500 - 7,800 cells/uL Final   Lymphs Abs 07/26/2021 2,328  850 - 3,900 cells/uL Final    Absolute Monocytes 07/26/2021 370  200 - 950 cells/uL Final   Eosinophils Absolute 07/26/2021 168  15 - 500 cells/uL Final   Basophils Absolute 07/26/2021 19  0 - 200 cells/uL Final   Neutrophils Relative % 07/26/2021 39.9  % Final   Total Lymphocyte 07/26/2021 48.5  % Final   Monocytes Relative 07/26/2021 7.7  % Final   Eosinophils Relative 07/26/2021 3.5  % Final   Basophils Relative 07/26/2021 0.4  % Final   Glucose, Bld 07/26/2021 84  65 - 99 mg/dL Final   Comment: .            Fasting reference interval .    BUN 07/26/2021 16  7 - 25 mg/dL Final   Creat 07/26/2021 1.24  0.60 - 1.26 mg/dL Final   eGFR 07/26/2021 76  > OR = 60 mL/min/1.9m2 Final   Comment: The eGFR is based on the CKD-EPI 2021 equation. To calculate  the new eGFR from a previous Creatinine or Cystatin C result, go to https://www.kidney.org/professionals/ kdoqi/gfr%5Fcalculator    BUN/Creatinine Ratio 40/98/1191 NOT APPLICABLE  6 - 22 (calc) Final   Sodium 07/26/2021 139  135 - 146 mmol/L Final  Potassium 07/26/2021 4.2  3.5 - 5.3 mmol/L Final   Chloride 07/26/2021 104  98 - 110 mmol/L Final   CO2 07/26/2021 29  20 - 32 mmol/L Final   Calcium 07/26/2021 9.3  8.6 - 10.3 mg/dL Final   Total Protein 07/26/2021 7.3  6.1 - 8.1 g/dL Final   Albumin 07/26/2021 4.1  3.6 - 5.1 g/dL Final   Globulin 07/26/2021 3.2  1.9 - 3.7 g/dL (calc) Final   AG Ratio 07/26/2021 1.3  1.0 - 2.5 (calc) Final   Total Bilirubin 07/26/2021 0.6  0.2 - 1.2 mg/dL Final   Alkaline phosphatase (APISO) 07/26/2021 55  36 - 130 U/L Final   AST 07/26/2021 26  10 - 40 U/L Final   ALT 07/26/2021 14  9 - 46 U/L Final   Cholesterol 07/26/2021 181  <200 mg/dL Final   HDL 07/26/2021 59  > OR = 40 mg/dL Final   Triglycerides 07/26/2021 43  <150 mg/dL Final   LDL Cholesterol (Calc) 07/26/2021 109 (A) mg/dL (calc) Final   Comment: Reference range: <100 . Desirable range <100 mg/dL for primary prevention;   <70 mg/dL for patients with CHD or diabetic  patients  with > or = 2 CHD risk factors. Marland Kitchen LDL-C is now calculated using the Martin-Hopkins  calculation, which is a validated novel method providing  better accuracy than the Friedewald equation in the  estimation of LDL-C.  Cresenciano Genre et al. Annamaria Helling. 2951;884(16): 2061-2068  (http://education.[facility].com/faq/FAQ164)    Total CHOL/HDL Ratio 07/26/2021 3.1  <5.0 (calc) Final   Non-HDL Cholesterol (Calc) 07/26/2021 122  <130 mg/dL (calc) Final   Comment: For patients with diabetes plus 1 major ASCVD risk  factor, treating to a non-HDL-C goal of <100 mg/dL  (LDL-C of <70 mg/dL) is considered a therapeutic  option.     Past Medical History:  Diagnosis Date   Urinary retention    History reviewed. No pertinent surgical history.  No Known Allergies Social History   Socioeconomic History   Marital status: Married    Spouse name: Not on file   Number of children: Not on file   Years of education: Not on file   Highest education level: Not on file  Occupational History   Not on file  Tobacco Use   Smoking status: Never   Smokeless tobacco: Never  Substance and Sexual Activity   Alcohol use: No   Drug use: No   Sexual activity: Not on file  Other Topics Concern   Not on file  Social History Narrative   Not on file   Social Determinants of Health   Financial Resource Strain: Not on file  Food Insecurity: Not on file  Transportation Needs: Not on file  Physical Activity: Not on file  Stress: Not on file  Social Connections: Not on file  Intimate Partner Violence: Not on file   Patient is married with 3 children.  He works as a Software engineer. Family History  Problem Relation Age of Onset   Cancer Father        prostate   Alzheimer's disease Father    Heart disease Maternal Uncle    Heart disease Maternal Grandmother    Heart disease Maternal Grandfather    Alzheimer's disease Paternal Aunt    Alzheimer's disease Paternal Uncle      Review of Systems   All other systems reviewed and are negative.     Objective:   Physical Exam Vitals reviewed.  Constitutional:      General:  He is not in acute distress.    Appearance: He is well-developed. He is not diaphoretic.  HENT:     Head: Normocephalic and atraumatic.     Right Ear: Tympanic membrane, ear canal and external ear normal.     Left Ear: Tympanic membrane, ear canal and external ear normal.     Nose: Nose normal. No congestion or rhinorrhea.     Mouth/Throat:     Pharynx: No oropharyngeal exudate.  Eyes:     General: No scleral icterus.       Right eye: No discharge.        Left eye: No discharge.     Extraocular Movements: Extraocular movements intact.     Conjunctiva/sclera: Conjunctivae normal.     Pupils: Pupils are equal, round, and reactive to light.  Neck:     Thyroid: No thyromegaly.     Vascular: No JVD.     Trachea: No tracheal deviation.  Cardiovascular:     Rate and Rhythm: Normal rate and regular rhythm.     Heart sounds: Normal heart sounds. No murmur heard.   No friction rub. No gallop.  Pulmonary:     Effort: Pulmonary effort is normal. No respiratory distress.     Breath sounds: Normal breath sounds. No stridor. No wheezing or rales.  Chest:     Chest wall: No tenderness.  Abdominal:     General: Bowel sounds are normal. There is no distension.     Palpations: Abdomen is soft. There is no mass.     Tenderness: There is no abdominal tenderness. There is no guarding or rebound.  Genitourinary:    Penis: Normal.      Testes: Normal.  Musculoskeletal:        General: No tenderness or deformity. Normal range of motion.     Cervical back: Normal range of motion and neck supple.  Lymphadenopathy:     Cervical: No cervical adenopathy.  Skin:    General: Skin is warm.     Coloration: Skin is not pale.     Findings: No erythema or rash.  Neurological:     General: No focal deficit present.     Mental Status: He is alert and oriented to person, place,  and time. Mental status is at baseline.     Cranial Nerves: No cranial nerve deficit.     Sensory: No sensory deficit.     Motor: No weakness or abnormal muscle tone.     Coordination: Coordination normal.     Gait: Gait normal.     Deep Tendon Reflexes: Reflexes are normal and symmetric. Reflexes normal.  Psychiatric:        Behavior: Behavior normal.        Thought Content: Thought content normal.        Judgment: Judgment normal.          Assessment & Plan:  Need for immunization against influenza - Plan: Flu Vaccine QUAD 23mo+IM (Fluarix, Fluzone & Alfiuria Quad PF)  General medical exam  Paroxysmal atrial fibrillation ([place]) Lab work is outstanding and physical exam is completely normal today.  I did recommend aspirin 325 mg daily for atrial fibrillation.  I agree that anticoagulation is excessive given his risk factors.  He is not requiring any rate or rhythm control at the present time.  Today he is in normal sinus rhythm.  We discussed whether would be beneficial for him to have a stress test or coronary calcium score but at the present  time given his absence of other concerning symptoms I do not feel that is necessary.  He did receive his flu shot today.  The remainder of his preventative care is up-to-date

## 2021-08-03 NOTE — Telephone Encounter (Signed)
I have sent Jocelyn Lamer an email with the updated ICD codes.

## 2021-08-17 NOTE — Telephone Encounter (Signed)
Email response from [place] at [facility], J Corzine's claim was refiled 9.27.22, we have not gotten a responce yet.   I have updated him Thank you, Viki

## 2021-10-21 ENCOUNTER — Encounter (INDEPENDENT_AMBULATORY_CARE_PROVIDER_SITE_OTHER): Payer: No Typology Code available for payment source | Admitting: Ophthalmology

## 2021-11-15 ENCOUNTER — Other Ambulatory Visit: Payer: Self-pay

## 2021-11-15 ENCOUNTER — Encounter (INDEPENDENT_AMBULATORY_CARE_PROVIDER_SITE_OTHER): Payer: No Typology Code available for payment source | Admitting: Ophthalmology

## 2021-11-15 DIAGNOSIS — H35413 Lattice degeneration of retina, bilateral: Secondary | ICD-10-CM | POA: Diagnosis not present

## 2021-11-15 DIAGNOSIS — H43393 Other vitreous opacities, bilateral: Secondary | ICD-10-CM

## 2021-11-15 DIAGNOSIS — H33303 Unspecified retinal break, bilateral: Secondary | ICD-10-CM | POA: Diagnosis not present

## 2021-11-22 ENCOUNTER — Encounter (INDEPENDENT_AMBULATORY_CARE_PROVIDER_SITE_OTHER): Payer: No Typology Code available for payment source | Admitting: Ophthalmology

## 2021-11-22 ENCOUNTER — Other Ambulatory Visit (HOSPITAL_COMMUNITY): Payer: Self-pay

## 2021-11-22 ENCOUNTER — Other Ambulatory Visit: Payer: Self-pay

## 2021-11-22 DIAGNOSIS — H33301 Unspecified retinal break, right eye: Secondary | ICD-10-CM | POA: Diagnosis not present

## 2021-11-22 MED ORDER — PREDNISOLONE ACETATE 1 % OP SUSP
1.0000 [drp] | Freq: Four times a day (QID) | OPHTHALMIC | 1 refills | Status: DC
  Filled 2021-11-22: qty 5, 7d supply, fill #0

## 2021-11-26 ENCOUNTER — Encounter (INDEPENDENT_AMBULATORY_CARE_PROVIDER_SITE_OTHER): Payer: No Typology Code available for payment source | Admitting: Ophthalmology

## 2021-11-26 ENCOUNTER — Other Ambulatory Visit: Payer: Self-pay

## 2021-11-26 DIAGNOSIS — H33302 Unspecified retinal break, left eye: Secondary | ICD-10-CM | POA: Diagnosis not present

## 2021-12-09 ENCOUNTER — Encounter (INDEPENDENT_AMBULATORY_CARE_PROVIDER_SITE_OTHER): Payer: No Typology Code available for payment source | Admitting: Ophthalmology

## 2021-12-09 ENCOUNTER — Other Ambulatory Visit: Payer: Self-pay

## 2021-12-09 DIAGNOSIS — H33303 Unspecified retinal break, bilateral: Secondary | ICD-10-CM

## 2021-12-14 ENCOUNTER — Encounter (INDEPENDENT_AMBULATORY_CARE_PROVIDER_SITE_OTHER): Payer: No Typology Code available for payment source | Admitting: Ophthalmology

## 2021-12-14 ENCOUNTER — Other Ambulatory Visit: Payer: Self-pay

## 2021-12-14 DIAGNOSIS — H33303 Unspecified retinal break, bilateral: Secondary | ICD-10-CM

## 2021-12-14 DIAGNOSIS — H43813 Vitreous degeneration, bilateral: Secondary | ICD-10-CM | POA: Diagnosis not present

## 2021-12-21 ENCOUNTER — Ambulatory Visit: Payer: No Typology Code available for payment source | Admitting: Cardiology

## 2021-12-21 NOTE — Progress Notes (Incomplete)
Cardiology Office Note:    Date:  12/21/2021   ID:  Carl Newton, DOB [birth year], MRN [id number]  PCP:  Susy Frizzle, MD   [facility] Providers Cardiologist:  None     Referring MD: Susy Frizzle, MD    History of Present Illness:    Carl Newton is a 41 y.o. male here for follow-up. He was initially seen 06/29/21 for the evaluation of atrial fibrillation per Dr. Dennard Schaumann. He last saw Dr. Dennard Schaumann 09/28/2020. On 04/21/2021 he contacted Dr. Dennard Schaumann and reported an episode of irregular heartbeat earlier that morning. First ECG from his iWatch showed him in atrial fibrillation, but another ECG repeated in 10 mins showed he was back in sinus rhythm. It was recommended that he start aspirin 325 mg daily, and he was scheduled for a cardiology consult.  He stated that he first developed an episode the day before Thanksgiving in 2021. He played tennis and felt tired afterwards.  No appetite.  Felt some mild nausea.  He felt out of rhythm.  His heart rate was noted to be in the 90s to 100s for many hours after his tennis match.   He had another episode on June 22 which she recorded with his apple watch that showed atrial fibrillation heart rates in the 100-110 range.  He felt the palpitations. This episode lasted only for about 20 minutes.  Since these have started, he noticed that his heart rate on his Apple Watch was sometimes 200 with playing vigorous tennis. He randomly will feel also his heart rate increase to the 130s when using stairs.  As far as family history goes, there is no evidence of atrial fibrillation.  His maternal grandfather however did have an MI at age 39 and died, his uncle had heart attack at age 106.  They were both heavy smokers.  His mother has hyperlipidemia.  His LDL is 118 TSH is normal creatinine 1.08 liver function normal.  Outside labs and notes reviewed.  He was not started on aspirin because his CHADs schore was so low that he would not  benefit from anticoagulation. He was given Cardizem to take as needed for irregular heartbeats.   He saw Dr. Dennard Schaumann 07/27/21 for regular check-up. He had not needed to take his Cardizem. However, he reported his heart rate could increase up to 180 bpm with minimal activity. They discussed stress test or calcium score but they were not deemed necessary as he was considered a low-risk individual.   Today, ***  He denies any palpitations, chest pain, or shortness of breath, lightheadedness, headaches, syncope, orthopnea, PND, lower extremity edema or exertional symptoms.  Past Medical History:  Diagnosis Date   Urinary retention     No past surgical history on file.  Current Medications: No outpatient medications have been marked as taking for the 12/21/21 encounter (Appointment) with Jerline Pain, MD.     Allergies:   Patient has no known allergies.   Social History   Socioeconomic History   Marital status: Married    Spouse name: Not on file   Number of children: Not on file   Years of education: Not on file   Highest education level: Not on file  Occupational History   Not on file  Tobacco Use   Smoking status: Never   Smokeless tobacco: Never  Substance and Sexual Activity   Alcohol use: No   Drug use: No   Sexual activity: Not on file  Other Topics Concern  Not on file  Social History Narrative   Not on file   Social Determinants of Health   Financial Resource Strain: Not on file  Food Insecurity: Not on file  Transportation Needs: Not on file  Physical Activity: Not on file  Stress: Not on file  Social Connections: Not on file     Family History: The patient's family history includes Alzheimer's disease in his father, paternal aunt, and paternal uncle; Cancer in his father; Heart disease in his maternal grandfather, maternal grandmother, and maternal uncle.  ROS:   Please see the history of present illness.    All other systems reviewed and are  negative.  EKGs/Labs/Other Studies Reviewed:    The following studies were reviewed today:   Monitor 10/21/2020: Normal sinus rhythm with range 44-182 bpm with Ave 72 bpm Isolated PAC's and PVC's 4 beat SVT salvo No sypmtoms reported.  Echo 09/30/2020: 1. Left ventricular ejection fraction, by estimation, is 60 to 65%. The  left ventricle has normal function. The left ventricle has no regional  wall motion abnormalities. Left ventricular diastolic parameters were  normal.   2. Right ventricular systolic function is normal. The right ventricular  size is normal. Tricuspid regurgitation signal is inadequate for assessing PA pressure.   3. The mitral valve is normal in structure. No evidence of mitral valve  regurgitation. No evidence of mitral stenosis.   4. The aortic valve is normal in structure. Aortic valve regurgitation is  not visualized. No aortic stenosis is present.   5. The inferior vena cava is normal in size with greater than 50%  respiratory variability, suggesting right atrial pressure of 3 mmHg.  EKG:  EKG was personally reviewed 12/21/21: Sinus ***, rate *** bpm 06/29/21 - SB 58, SA EKG was noted on 09/28/2020 to be normal.  Normal sinus rhythm.  Normal intervals.  Recent Labs: 07/26/2021: ALT 14; BUN 16; Creat 1.24; Hemoglobin 13.6; Platelets 249; Potassium 4.2; Sodium 139  Recent Lipid Panel    Component Value Date/Time   CHOL 181 07/26/2021 0825   TRIG 43 07/26/2021 0825   HDL 59 07/26/2021 0825   CHOLHDL 3.1 07/26/2021 0825   LDLCALC 109 (H) 07/26/2021 0825     Risk Assessment/Calculations:   CHA2DS2-VASc Score = 0  The patient's score is based upon: CHF History: 0 HTN History: 0 Diabetes History: 0 Stroke History: 0 Vascular Disease History: 0 Age Score: 0 Gender Score: 0     Physical Exam:    VS:  There were no vitals taken for this visit.    Wt Readings from Last 3 Encounters:  07/27/21 156 lb (70.8 kg)  06/29/21 157 lb (71.2 kg)   09/28/20 153 lb (69.4 kg)     GEN:  Well nourished, well developed in no acute distress HEENT: Normal NECK: No JVD; No carotid bruits LYMPHATICS: No lymphadenopathy CARDIAC: RRR, no murmurs, rubs, gallops RESPIRATORY:  Clear to auscultation without rales, wheezing or rhonchi  ABDOMEN: Soft, non-tender, non-distended MUSCULOSKELETAL:  No edema; No deformity  SKIN: Warm and dry NEUROLOGIC:  Alert and oriented x 3 PSYCHIATRIC:  Normal affect   ASSESSMENT:    No diagnosis found.  PLAN:     No problem-specific Assessment & Plan notes found for this encounter.         Follow-up: 6 months  Medication Adjustments/Labs and Tests Ordered: Current medicines are reviewed at length with the patient today.  Concerns regarding medicines are outlined above.  No orders of the defined types were placed  in this encounter.  No orders of the defined types were placed in this encounter.   There are no Patient Instructions on file for this visit.   I,Mykaella Javier,acting as a scribe for [employer], MD.,have documented all relevant documentation on the behalf of Candee Furbish, MD,as directed by  Candee Furbish, MD while in the presence of Candee Furbish, MD.  ***  Signed, Orion Crook  12/21/2021 11:23 AM    [place]

## 2022-02-18 ENCOUNTER — Ambulatory Visit (INDEPENDENT_AMBULATORY_CARE_PROVIDER_SITE_OTHER): Payer: No Typology Code available for payment source | Admitting: Family Medicine

## 2022-02-18 ENCOUNTER — Other Ambulatory Visit (HOSPITAL_BASED_OUTPATIENT_CLINIC_OR_DEPARTMENT_OTHER): Payer: Self-pay

## 2022-02-18 VITALS — BP 120/80 | HR 57 | Temp 97.6°F | Ht 67.0 in | Wt 157.2 lb

## 2022-02-18 DIAGNOSIS — N529 Male erectile dysfunction, unspecified: Secondary | ICD-10-CM

## 2022-02-18 DIAGNOSIS — M5432 Sciatica, left side: Secondary | ICD-10-CM

## 2022-02-18 MED ORDER — CYCLOBENZAPRINE HCL 10 MG PO TABS
10.0000 mg | ORAL_TABLET | Freq: Three times a day (TID) | ORAL | 0 refills | Status: DC | PRN
  Filled 2022-02-18: qty 30, 10d supply, fill #0

## 2022-02-18 MED ORDER — PREDNISONE 20 MG PO TABS
ORAL_TABLET | ORAL | 0 refills | Status: DC
  Filled 2022-02-18: qty 12, 6d supply, fill #0

## 2022-02-18 NOTE — Progress Notes (Signed)
? ?Subjective:  ? ? Patient ID: Carl Newton, male    DOB: [birth year], 41 y.o.   MRN: [id number] ? ?HPI ? ?Patient is a 41 year old Caucasian gentleman who presents today with low back pain.  He states that he injured it about a week ago.  He was playing golf and playing tennis.  He felt his back get tight around the level of T12 and L1 but he was able to maintain and continue to play.  He felt like he just pulled a muscle in his back however the next day at [place], he bent over to try to pick up a bag of salt when he felt something "go out in his back".  He then had pain radiating into his tailbone.  He also has pain radiating to his posterior left hip.  He has pain staying in his posterior left gluteus.  He states that this pain is much different than pulm muscles he has had in the past.  He denies any bowel or bladder incontinence.  He denies any leg weakness.  He denies any saddle anesthesia. ? ?He also reports some erectile difficulties.  He is concerned because there is a strong family history of coronary artery disease.  He is also concerned about low testosterone. ? ? ?Past Medical History:  ?Diagnosis Date  ? Urinary retention   ? ?No past surgical history on file. ? ?No Known Allergies ?Social History  ? ?Socioeconomic History  ? Marital status: Married  ?  Spouse name: Not on file  ? Number of children: Not on file  ? Years of education: Not on file  ? Highest education level: Not on file  ?Occupational History  ? Not on file  ?Tobacco Use  ? Smoking status: Never  ? Smokeless tobacco: Never  ?Substance and Sexual Activity  ? Alcohol use: No  ? Drug use: No  ? Sexual activity: Not on file  ?Other Topics Concern  ? Not on file  ?Social History Narrative  ? Not on file  ? ?Social Determinants of Health  ? ?Financial Resource Strain: Not on file  ?Food Insecurity: Not on file  ?Transportation Needs: Not on file  ?Physical Activity: Not on file  ?Stress: Not on file  ?Social Connections: Not on file   ?Intimate Partner Violence: Not on file  ? ?Patient is married with 3 children.  He works as a Software engineer. ?Family History  ?Problem Relation Age of Onset  ? Cancer Father   ?     prostate  ? Alzheimer's disease Father   ? Heart disease Maternal Uncle   ? Heart disease Maternal Grandmother   ? Heart disease Maternal Grandfather   ? Alzheimer's disease Paternal Aunt   ? Alzheimer's disease Paternal Uncle   ? ? ? ?Review of Systems  ?All other systems reviewed and are negative. ? ?   ?Objective:  ? Physical Exam ?Vitals reviewed.  ?Constitutional:   ?   General: He is not in acute distress. ?   Appearance: He is well-developed. He is not diaphoretic.  ?HENT:  ?   Head: Normocephalic and atraumatic.  ?Neck:  ?   Thyroid: No thyromegaly.  ?   Vascular: No JVD.  ?   Trachea: No tracheal deviation.  ?Cardiovascular:  ?   Rate and Rhythm: Normal rate and regular rhythm.  ?   Heart sounds: Normal heart sounds. No murmur heard. ?  No friction rub. No gallop.  ?Pulmonary:  ?   Effort: Pulmonary effort  is normal. No respiratory distress.  ?   Breath sounds: Normal breath sounds. No stridor. No wheezing or rales.  ?Chest:  ?   Chest wall: No tenderness.  ?Musculoskeletal:  ?   Cervical back: Normal range of motion and neck supple.  ?   Lumbar back: Tenderness present. No swelling, spasms or bony tenderness. Decreased range of motion. Positive right straight leg raise test. Negative left straight leg raise test.  ?Lymphadenopathy:  ?   Cervical: No cervical adenopathy.  ?Skin: ?   General: Skin is warm.  ?   Findings: No rash.  ?Neurological:  ?   General: No focal deficit present.  ?   Mental Status: He is alert and oriented to person, place, and time. Mental status is at baseline.  ?   Cranial Nerves: No cranial nerve deficit.  ?   Sensory: No sensory deficit.  ?   Motor: No weakness or abnormal muscle tone.  ?   Coordination: Coordination normal.  ?   Gait: Gait normal.  ?   Deep Tendon Reflexes: Reflexes are normal and  symmetric. Reflexes normal.  ? ? ? ? ? ?   ?Assessment & Plan:  ?Left sided sciatica - Plan: DG Lumbar Spine Complete ? ?Erectile dysfunction, unspecified erectile dysfunction type - Plan: Testosterone Total,Free,Bio, Males ?I believe the patient likely herniated a disc in his back.  We discussed prednisone but the patient will try Motrin 800 mg every 8 hours for 1 week.  If getting worse he can try a prednisone taper pack.  He can use Flexeril for muscle spasms.  Proceed with an x-ray of the back if the pain is worsening.  Recommended range of motion stretching but no lifting.  He will come back fasting for a testosterone level.  If his testosterone level is normal we can try Viagra as needed for erectile dysfunction.  Given his family history of coronary artery disease on his mother side and premature erectile dysfunction, would consider a coronary artery calcium score to determine if the patient is developing arthrosclerosis. ?

## 2022-02-22 ENCOUNTER — Other Ambulatory Visit (HOSPITAL_BASED_OUTPATIENT_CLINIC_OR_DEPARTMENT_OTHER): Payer: Self-pay

## 2022-02-24 ENCOUNTER — Other Ambulatory Visit: Payer: No Typology Code available for payment source

## 2022-02-24 ENCOUNTER — Encounter: Payer: Self-pay | Admitting: Family Medicine

## 2022-02-24 NOTE — Telephone Encounter (Signed)
Labs are ordered for tom ?

## 2022-02-25 ENCOUNTER — Other Ambulatory Visit: Payer: No Typology Code available for payment source

## 2022-02-25 DIAGNOSIS — I480 Paroxysmal atrial fibrillation: Secondary | ICD-10-CM

## 2022-02-26 LAB — TESTOSTERONE: Testosterone: 554 ng/dL (ref 250–827)

## 2022-02-26 LAB — LIPID PANEL
Cholesterol: 222 mg/dL — ABNORMAL HIGH (ref <200)
HDL: 66 mg/dL (ref >=40)
LDL Cholesterol (Calc): 138 mg/dL (calc) — ABNORMAL HIGH
Non-HDL Cholesterol (Calc): 156 mg/dL (calc) — ABNORMAL HIGH (ref <130)
Total CHOL/HDL Ratio: 3.4 (calc) (ref <5.0)
Triglycerides: 82 mg/dL (ref <150)

## 2022-02-28 ENCOUNTER — Encounter: Payer: Self-pay | Admitting: Family Medicine

## 2022-02-28 ENCOUNTER — Other Ambulatory Visit: Payer: Self-pay | Admitting: Family Medicine

## 2022-02-28 DIAGNOSIS — E7801 Familial hypercholesterolemia: Secondary | ICD-10-CM

## 2022-03-17 ENCOUNTER — Ambulatory Visit: Payer: No Typology Code available for payment source | Admitting: Cardiology

## 2022-03-18 ENCOUNTER — Ambulatory Visit (HOSPITAL_COMMUNITY)
Admission: RE | Admit: 2022-03-18 | Discharge: 2022-03-18 | Disposition: A | Discharge status: Home / Self Care | Payer: Self-pay | Source: Ambulatory Visit | Attending: Family Medicine | Admitting: Family Medicine

## 2022-03-18 DIAGNOSIS — E7801 Familial hypercholesterolemia: Secondary | ICD-10-CM | POA: Insufficient documentation

## 2022-04-14 ENCOUNTER — Encounter (INDEPENDENT_AMBULATORY_CARE_PROVIDER_SITE_OTHER): Payer: No Typology Code available for payment source | Admitting: Ophthalmology

## 2022-04-14 DIAGNOSIS — H33303 Unspecified retinal break, bilateral: Secondary | ICD-10-CM | POA: Diagnosis not present

## 2022-04-14 DIAGNOSIS — H43813 Vitreous degeneration, bilateral: Secondary | ICD-10-CM | POA: Diagnosis not present

## 2022-04-14 DIAGNOSIS — H35411 Lattice degeneration of retina, right eye: Secondary | ICD-10-CM | POA: Diagnosis not present

## 2022-04-26 ENCOUNTER — Ambulatory Visit (INDEPENDENT_AMBULATORY_CARE_PROVIDER_SITE_OTHER): Payer: No Typology Code available for payment source | Admitting: Cardiology

## 2022-04-26 ENCOUNTER — Encounter: Payer: Self-pay | Admitting: Cardiology

## 2022-04-26 DIAGNOSIS — I480 Paroxysmal atrial fibrillation: Secondary | ICD-10-CM

## 2022-04-26 NOTE — Progress Notes (Signed)
Cardiology Office Note:    Date:  04/26/2022   ID:  Carl Newton, DOB [birth year], MRN [id number]  PCP:  Donita Brooks, MD   [facility] Providers Cardiologist:  Donato Schultz, MD     Referring MD: Donita Brooks, MD    History of Present Illness:    Carl Newton is a 41 y.o. male here for the follow up of palpitations and atrial fibrillation.  Previously here for the evaluation of atrial fibrillation per Dr. Tanya Nones. He last saw Dr. Tanya Nones 09/28/2020. On 04/21/2021 he contacted Dr. Tanya Nones and reported an episode of irregular heartbeat earlier that morning. First ECG from his iWatch showed him in atrial fibrillation, but another ECG repeated in 10 mins showed he was back in sinus rhythm. It was recommended that he start aspirin 325 mg daily, and he was scheduled for a cardiology consult.  At his last appointment he reported that he first developed an episode the day before Thanksgiving in 2021.  He plays quite a bit of tennis and he felt tired afterwards.  No appetite.  Felt some mild nausea.  He felt out of rhythm.  His heart rate was noted to be in the 90s to 100s for many hours after his tennis match.  This was unusual for him.  He had another episode on June 22 which he recorded with his apple watch that showed atrial fibrillation heart rates in the 100-110 range.  He felt the palpitations.  This episode lasted only for about 20 minutes however.  As far as family history goes, there is no evidence of atrial fibrillation.  His maternal grandfather however did have an MI at age 49 and died, his uncle had heart attack at age 36.  They were both heavy smokers.  His mother has hyperlipidemia.  His LDL is 118 TSH is normal creatinine 1.08 liver function normal.  Outside labs and notes reviewed.  Today, he notes that he has not needed to take his diltiazem at all. His last known episode of Afib was April 21, 2021.  Occasionally he has noticed palpitations, but when  he checks his smart watch he is in sinus rhythm. He has not felt the same irregularities he did when he was previously in Afib.  For the past 8 weeks he has not been able to exercise much due to a back issue. When he was exercising, his heart rate will easily reach 180-200 beats per minute for 1.5 hours.  He has not needed to utilize any diltiazem   He denies any chest pain, shortness of breath, or peripheral edema. No lightheadedness, headaches, syncope, orthopnea, or PND.   Past Medical History:  Diagnosis Date   Urinary retention     No past surgical history on file.  Current Medications: Current Meds  Medication Sig   cyclobenzaprine (FLEXERIL) 10 MG tablet Take 1 tablet (10 mg total) by mouth 3 (three) times daily as needed for muscle spasms.   diltiazem (CARDIZEM) 30 MG tablet Take 1 tablet (30 mg total) by mouth 4 (four) times daily.   loratadine (CLARITIN) 10 MG tablet Take 1 tablet by mouth every day as needed for allergies.     Allergies:   Patient has no known allergies.   Social History   Socioeconomic History   Marital status: Married    Spouse name: Not on file   Number of children: Not on file   Years of education: Not on file   Highest education level: Not on  file  Occupational History   Not on file  Tobacco Use   Smoking status: Never   Smokeless tobacco: Never  Substance and Sexual Activity   Alcohol use: No   Drug use: No   Sexual activity: Not on file  Other Topics Concern   Not on file  Social History Narrative   Not on file   Social Determinants of Health   Financial Resource Strain: Not on file  Food Insecurity: Not on file  Transportation Needs: Not on file  Physical Activity: Not on file  Stress: Not on file  Social Connections: Not on file     Family History: The patient's family history includes Alzheimer's disease in his father, paternal aunt, and paternal uncle; Cancer in his father; Heart disease in his maternal grandfather,  maternal grandmother, and maternal uncle.  ROS:   Please see the history of present illness.    (+) Palpitations All other systems reviewed and are negative.  EKGs/Labs/Other Studies Reviewed:    The following studies were reviewed today:   Coronary Calcium Score  03/18/2022: FINDINGS: Coronary arteries: Normal origins.   Coronary Calcium Score:   Left main: 0   Left anterior descending artery: 0   Left circumflex artery: 0   Right coronary artery: 0   Total: 0   Percentile: 0   Pericardium: Normal.   Aorta: Normal caliber.   Non-cardiac: See separate report from [facility].   IMPRESSION: Coronary calcium score of 0. This is a low risk study.  Monitor 10/21/2020: Normal sinus rhythm with range 44-182 bpm with Ave 72 bpm Isolated PAC's and PVC's 4 beat SVT salvo No sypmtoms reported.  Echo 09/30/2020: 1. Left ventricular ejection fraction, by estimation, is 60 to 65%. The  left ventricle has normal function. The left ventricle has no regional  wall motion abnormalities. Left ventricular diastolic parameters were  normal.   2. Right ventricular systolic function is normal. The right ventricular  size is normal. Tricuspid regurgitation signal is inadequate for assessing  PA pressure.   3. The mitral valve is normal in structure. No evidence of mitral valve  regurgitation. No evidence of mitral stenosis.   4. The aortic valve is normal in structure. Aortic valve regurgitation is  not visualized. No aortic stenosis is present.   5. The inferior vena cava is normal in size with greater than 50%  respiratory variability, suggesting right atrial pressure of 3 mmHg.   EKG:  EKG is personally reviewed and interpreted.  04/26/2022:  EKG was not ordered. 06/29/21 - SB 58, SA 09/28/2020: Normal sinus rhythm.  Normal intervals.   Recent Labs: 07/26/2021: ALT 14; BUN 16; Creat 1.24; Hemoglobin 13.6; Platelets 249; Potassium 4.2; Sodium 139   Recent Lipid  Panel    Component Value Date/Time   CHOL 222 (H) 02/25/2022 0807   TRIG 82 02/25/2022 0807   HDL 66 02/25/2022 0807   CHOLHDL 3.4 02/25/2022 0807   LDLCALC 138 (H) 02/25/2022 0807     Risk Assessment/Calculations:   CHA2DS2-VASc Score = 0  The patient's score is based upon: CHF History: 0 HTN History: 0 Diabetes History: 0 Stroke History: 0 Vascular Disease History: 0 Age Score: 0 Gender Score: 0    Physical Exam:    VS:  BP 90/60 (BP Location: Left Arm, Patient Position: Sitting, Cuff Size: Normal)   Pulse 68   Ht 5\' 7"  (1.702 m)   Wt 161 lb (73 kg)   SpO2 98%   BMI 25.22 kg/m  Wt Readings from Last 3 Encounters:  04/26/22 161 lb (73 kg)  02/18/22 157 lb 3.2 oz (71.3 kg)  07/27/21 156 lb (70.8 kg)     GEN:  Well nourished, well developed in no acute distress HEENT: Normal NECK: No JVD; No carotid bruits LYMPHATICS: No lymphadenopathy CARDIAC: RRR, no murmurs, rubs, gallops RESPIRATORY:  Clear to auscultation without rales, wheezing or rhonchi  ABDOMEN: Soft, non-tender, non-distended MUSCULOSKELETAL:  No edema; No deformity  SKIN: Warm and dry NEUROLOGIC:  Alert and oriented x 3 PSYCHIATRIC:  Normal affect   ASSESSMENT:    1. Paroxysmal atrial fibrillation (HCC)     PLAN:    In order of problems listed above:  Paroxysmal atrial fibrillation (HCC) Overall doing well no further episodes of atrial fibrillation.  Discussed potential ablation if symptoms/atrial fibrillation more frequently occurs.  All testing unremarkable.  Reviewed with him.  Excellent.  We will follow-up in 2 years.      Follow-up: 2 years.  Medication Adjustments/Labs and Tests Ordered: Current medicines are reviewed at length with the patient today.  Concerns regarding medicines are outlined above.   No orders of the defined types were placed in this encounter.  No orders of the defined types were placed in this encounter.  Patient Instructions  Medication Instructions:   The current medical regimen is effective;  continue present plan and medications.  *If you need a refill on your cardiac medications before your next appointment, please call your pharmacy*  Follow-Up: At [facility], you and your health needs are our priority.  As part of our continuing mission to provide you with exceptional heart care, we have created designated Provider Care Teams.  These Care Teams include your primary Cardiologist (physician) and Advanced Practice Providers (APPs -  Physician Assistants and Nurse Practitioners) who all work together to provide you with the care you need, when you need it.  We recommend signing up for the patient portal called "MyChart".  Sign up information is provided on this After Visit Summary.  MyChart is used to connect with patients for Virtual Visits (Telemedicine).  Patients are able to view lab/test results, encounter notes, upcoming appointments, etc.  Non-urgent messages can be sent to your provider as well.   To learn more about what you can do with MyChart, go to [email or web address].    Your next appointment:   2 year(s)  The format for your next appointment:   In Person  Provider:   Dr Donato Schultz {   Important Information About Sugar         I,Mathew Stumpf,acting as a scribe for Donato Schultz, MD.,have documented all relevant documentation on the behalf of Donato Schultz, MD,as directed by  Donato Schultz, MD while in the presence of Donato Schultz, MD.  I, Donato Schultz, MD, have reviewed all documentation for this visit. The documentation on 04/26/22 for the exam, diagnosis, procedures, and orders are all accurate and complete.   Signed, Donato Schultz, MD  04/26/2022 4:26 PM     [facility]

## 2022-07-12 ENCOUNTER — Ambulatory Visit (INDEPENDENT_AMBULATORY_CARE_PROVIDER_SITE_OTHER): Payer: No Typology Code available for payment source | Admitting: Family Medicine

## 2022-07-12 ENCOUNTER — Encounter: Payer: Self-pay | Admitting: Family Medicine

## 2022-07-12 VITALS — BP 108/52 | HR 67 | Temp 98.0°F | Ht 67.0 in | Wt 163.2 lb

## 2022-07-12 DIAGNOSIS — S39011A Strain of muscle, fascia and tendon of abdomen, initial encounter: Secondary | ICD-10-CM

## 2022-07-12 NOTE — Progress Notes (Signed)
Subjective:    Patient ID: Carl Newton, male    DOB: July 10, 1981, 41 y.o.   MRN: [id number]  HPI  Patient reports a pressure-like discomfort below his xiphoid process.  He denies any nausea or vomiting.  He denies any melena or hematochezia.  He denies any tenderness to palpation in that area.  He denies any heartburn.  He denies any intensification of the pain with food.  He denies any right upper quadrant pain.  There is no pain with palpation over the pancreas.  The the discomfort seems to worsen with movement.  I believe he likely sprained his upper rectus muscle.  The patient is very athletic and frequently plays tennis.  I believe he may have sprained the rectus abdominis.  There is no palpable hernia. Past Medical History:  Diagnosis Date   Atrial fibrillation [facility])    Urinary retention     No past surgical history on file.  No Known Allergies Social History   Socioeconomic History   Marital status: Married    Spouse name: Not on file   Number of children: Not on file   Years of education: Not on file   Highest education level: Not on file  Occupational History   Not on file  Tobacco Use   Smoking status: Never   Smokeless tobacco: Never  Substance and Sexual Activity   Alcohol use: No   Drug use: No   Sexual activity: Not on file  Other Topics Concern   Not on file  Social History Narrative   Not on file   Social Determinants of Health   Financial Resource Strain: Not on file  Food Insecurity: Not on file  Transportation Needs: Not on file  Physical Activity: Not on file  Stress: Not on file  Social Connections: Not on file  Intimate Partner Violence: Not on file   Patient is married with 3 children.  He works as a Software engineer. Family History  Problem Relation Age of Onset   Cancer Father        prostate   Alzheimer's disease Father    Heart disease Maternal Uncle    Heart disease Maternal Grandmother    Heart disease Maternal Grandfather     Alzheimer's disease Paternal Aunt    Alzheimer's disease Paternal Uncle      Review of Systems  All other systems reviewed and are negative.      Objective:   Physical Exam Vitals reviewed.  Constitutional:      General: He is not in acute distress.    Appearance: He is well-developed. He is not diaphoretic.  HENT:     Head: Normocephalic and atraumatic.     Right Ear: Tympanic membrane, ear canal and external ear normal.     Left Ear: Tympanic membrane, ear canal and external ear normal.     Nose: Nose normal. No congestion or rhinorrhea.  Eyes:     General:        Right eye: No discharge.        Left eye: No discharge.     Conjunctiva/sclera: Conjunctivae normal.  Neck:     Thyroid: No thyromegaly.     Vascular: No JVD.     Trachea: No tracheal deviation.  Cardiovascular:     Rate and Rhythm: Normal rate and regular rhythm.     Heart sounds: Normal heart sounds. No murmur heard.    No friction rub. No gallop.  Pulmonary:     Effort: Pulmonary effort  is normal. No respiratory distress.     Breath sounds: Normal breath sounds. No stridor. No wheezing or rales.  Chest:     Chest wall: No tenderness.  Abdominal:     General: Abdomen is flat. Bowel sounds are normal. There is no distension. There are no signs of injury.     Palpations: Abdomen is soft. There is no mass.     Tenderness: There is no abdominal tenderness. There is no guarding or rebound. Negative signs include Murphy's sign.     Hernia: No hernia is present. There is no hernia in the umbilical area or ventral area.    Musculoskeletal:        General: No tenderness or deformity. Normal range of motion.     Cervical back: Normal range of motion and neck supple.  Lymphadenopathy:     Cervical: No cervical adenopathy.  Skin:    General: Skin is warm.     Coloration: Skin is not pale.     Findings: No erythema or rash.  Neurological:     General: No focal deficit present.     Mental Status: He is alert  and oriented to person, place, and time. Mental status is at baseline.     Cranial Nerves: No cranial nerve deficit.     Sensory: No sensory deficit.     Motor: No weakness or abnormal muscle tone.     Coordination: Coordination normal.     Gait: Gait normal.     Deep Tendon Reflexes: Reflexes are normal and symmetric.  Psychiatric:        Thought Content: Thought content normal.        Judgment: Judgment normal.           Assessment & Plan:  Strain of abdominal wall, initial encounter Patient's discomfort is very superficial.  I believe this is most likely musculoskeletal.  He denies any symptoms to make me concerned about gastritis or an ulcer or pancreatitis or biliary tract disease.  The discomfort seems to be triggered by movement and not by food.  Recommended tincture of time and we will watch this area clinically.  If the pain changes or worsens he will notify me immediately.

## 2022-08-09 ENCOUNTER — Other Ambulatory Visit: Payer: No Typology Code available for payment source

## 2022-08-09 DIAGNOSIS — I480 Paroxysmal atrial fibrillation: Secondary | ICD-10-CM

## 2022-08-09 DIAGNOSIS — Z13220 Encounter for screening for lipoid disorders: Secondary | ICD-10-CM

## 2022-08-09 DIAGNOSIS — Z1329 Encounter for screening for other suspected endocrine disorder: Secondary | ICD-10-CM

## 2022-08-09 DIAGNOSIS — I499 Cardiac arrhythmia, unspecified: Secondary | ICD-10-CM

## 2022-08-10 LAB — CBC WITH DIFFERENTIAL/PLATELET
Absolute Monocytes: 427 cells/uL (ref 200–950)
Basophils Absolute: 12 cells/uL (ref 0–200)
Basophils Relative: 0.2 %
Eosinophils Absolute: 159 cells/uL (ref 15–500)
Eosinophils Relative: 2.6 %
HCT: 41.7 % (ref 38.5–50.0)
Hemoglobin: 14.3 g/dL (ref 13.2–17.1)
Lymphs Abs: 2690 cells/uL (ref 850–3900)
MCH: 30.5 pg (ref 27.0–33.0)
MCHC: 34.3 g/dL (ref 32.0–36.0)
MCV: 88.9 fL (ref 80.0–100.0)
MPV: 9.8 fL (ref 7.5–12.5)
Monocytes Relative: 7 %
Neutro Abs: 2812 cells/uL (ref 1500–7800)
Neutrophils Relative %: 46.1 %
Platelets: 234 10*3/uL (ref 140–400)
RBC: 4.69 10*6/uL (ref 4.20–5.80)
RDW: 12.2 % (ref 11.0–15.0)
Total Lymphocyte: 44.1 %
WBC: 6.1 10*3/uL (ref 3.8–10.8)

## 2022-08-10 LAB — COMPREHENSIVE METABOLIC PANEL
AG Ratio: 1.3 (calc) (ref 1.0–2.5)
ALT: 14 U/L (ref 9–46)
AST: 15 U/L (ref 10–40)
Albumin: 4.3 g/dL (ref 3.6–5.1)
Alkaline phosphatase (APISO): 63 U/L (ref 36–130)
BUN: 14 mg/dL (ref 7–25)
CO2: 25 mmol/L (ref 20–32)
Calcium: 9.2 mg/dL (ref 8.6–10.3)
Chloride: 105 mmol/L (ref 98–110)
Creat: 1.11 mg/dL (ref 0.60–1.26)
Globulin: 3.2 g/dL (calc) (ref 1.9–3.7)
Glucose, Bld: 89 mg/dL (ref 65–99)
Potassium: 4.2 mmol/L (ref 3.5–5.3)
Sodium: 138 mmol/L (ref 135–146)
Total Bilirubin: 0.5 mg/dL (ref 0.2–1.2)
Total Protein: 7.5 g/dL (ref 6.1–8.1)

## 2022-08-10 LAB — LIPID PANEL
Cholesterol: 206 mg/dL — ABNORMAL HIGH (ref <200)
HDL: 63 mg/dL (ref >=40)
LDL Cholesterol (Calc): 123 mg/dL (calc) — ABNORMAL HIGH
Non-HDL Cholesterol (Calc): 143 mg/dL (calc) — ABNORMAL HIGH (ref <130)
Total CHOL/HDL Ratio: 3.3 (calc) (ref <5.0)
Triglycerides: 94 mg/dL (ref <150)

## 2022-08-10 LAB — TSH: TSH: 1.51 mIU/L (ref 0.40–4.50)

## 2022-08-11 ENCOUNTER — Ambulatory Visit (INDEPENDENT_AMBULATORY_CARE_PROVIDER_SITE_OTHER): Payer: No Typology Code available for payment source | Admitting: Family Medicine

## 2022-08-11 VITALS — BP 110/60 | HR 60 | Ht 67.0 in | Wt 163.0 lb

## 2022-08-11 DIAGNOSIS — D229 Melanocytic nevi, unspecified: Secondary | ICD-10-CM | POA: Diagnosis not present

## 2022-08-11 DIAGNOSIS — Z0000 Encounter for general adult medical examination without abnormal findings: Secondary | ICD-10-CM | POA: Diagnosis not present

## 2022-08-11 DIAGNOSIS — Z23 Encounter for immunization: Secondary | ICD-10-CM | POA: Diagnosis not present

## 2022-08-11 NOTE — Progress Notes (Signed)
Subjective:    Patient ID: Carl Newton, male    DOB: [birth year], 41 y.o.   MRN: [id number]  HPI  Patient is a very pleasant 41 year old Caucasian male here today for complete physical exam.  Patient has an atypical mole just below his xiphoid process.  It is approximately 4 mm in diameter.  The majority the mole is light brown but there is dark black punctate spots in the center of the mole that seem to be enlarging.  The patient would like to have this biopsied today as it seems to be changing.  Otherwise he is doing well.  He is due for COVID booster, tetanus shot, and a flu shot.  He would like to get flu shot today.  He had a coronary artery calcium score in May that was 0!  His most recent lab work is listed below Lab on 08/09/2022  Component Date Value Ref Range Status   Cholesterol 08/09/2022 206 (H)  <200 mg/dL Final   HDL 08/09/2022 63  > OR = 40 mg/dL Final   Triglycerides 08/09/2022 94  <150 mg/dL Final   LDL Cholesterol (Calc) 08/09/2022 123 (H)  mg/dL (calc) Final   Comment: Reference range: <100 . Desirable range <100 mg/dL for primary prevention;   <70 mg/dL for patients with CHD or diabetic patients  with > or = 2 CHD risk factors. Marland Kitchen LDL-C is now calculated using the Martin-Hopkins  calculation, which is a validated novel method providing  better accuracy than the Friedewald equation in the  estimation of LDL-C.  Cresenciano Genre et al. Annamaria Helling. 7416;384(53): 2061-2068  (http://education.[facility].com/faq/FAQ164)    Total CHOL/HDL Ratio 08/09/2022 3.3  <5.0 (calc) Final   Non-HDL Cholesterol (Calc) 08/09/2022 143 (H)  <130 mg/dL (calc) Final   Comment: For patients with diabetes plus 1 major ASCVD risk  factor, treating to a non-HDL-C goal of <100 mg/dL  (LDL-C of <70 mg/dL) is considered a therapeutic  option.    TSH 08/09/2022 1.51  0.40 - 4.50 mIU/L Final   Glucose, Bld 08/09/2022 89  65 - 99 mg/dL Final   Comment: .            Fasting reference  interval .    BUN 08/09/2022 14  7 - 25 mg/dL Final   Creat 08/09/2022 1.11  0.60 - 1.26 mg/dL Final   BUN/Creatinine Ratio 08/09/2022 SEE NOTE:  6 - 22 (calc) Final   Comment:    Not Reported: BUN and Creatinine are within    reference range. .    Sodium 08/09/2022 138  135 - 146 mmol/L Final   Potassium 08/09/2022 4.2  3.5 - 5.3 mmol/L Final   Chloride 08/09/2022 105  98 - 110 mmol/L Final   CO2 08/09/2022 25  20 - 32 mmol/L Final   Calcium 08/09/2022 9.2  8.6 - 10.3 mg/dL Final   Total Protein 08/09/2022 7.5  6.1 - 8.1 g/dL Final   Albumin 08/09/2022 4.3  3.6 - 5.1 g/dL Final   Globulin 08/09/2022 3.2  1.9 - 3.7 g/dL (calc) Final   AG Ratio 08/09/2022 1.3  1.0 - 2.5 (calc) Final   Total Bilirubin 08/09/2022 0.5  0.2 - 1.2 mg/dL Final   Alkaline phosphatase (APISO) 08/09/2022 63  36 - 130 U/L Final   AST 08/09/2022 15  10 - 40 U/L Final   ALT 08/09/2022 14  9 - 46 U/L Final   WBC 08/09/2022 6.1  3.8 - 10.8 Thousand/uL Final   RBC 08/09/2022 4.69  4.20 - 5.80 Million/uL Final   Hemoglobin 08/09/2022 14.3  13.2 - 17.1 g/dL Final   HCT 08/09/2022 41.7  38.5 - 50.0 % Final   MCV 08/09/2022 88.9  80.0 - 100.0 fL Final   MCH 08/09/2022 30.5  27.0 - 33.0 pg Final   MCHC 08/09/2022 34.3  32.0 - 36.0 g/dL Final   RDW 08/09/2022 12.2  11.0 - 15.0 % Final   Platelets 08/09/2022 234  140 - 400 Thousand/uL Final   MPV 08/09/2022 9.8  7.5 - 12.5 fL Final   Neutro Abs 08/09/2022 2,812  1,500 - 7,800 cells/uL Final   Lymphs Abs 08/09/2022 2,690  850 - 3,900 cells/uL Final   Absolute Monocytes 08/09/2022 427  200 - 950 cells/uL Final   Eosinophils Absolute 08/09/2022 159  15 - 500 cells/uL Final   Basophils Absolute 08/09/2022 12  0 - 200 cells/uL Final   Neutrophils Relative % 08/09/2022 46.1  % Final   Total Lymphocyte 08/09/2022 44.1  % Final   Monocytes Relative 08/09/2022 7.0  % Final   Eosinophils Relative 08/09/2022 2.6  % Final   Basophils Relative 08/09/2022 0.2  % Final       Past Medical History:  Diagnosis Date   Atrial fibrillation [facility])    Urinary retention    No past surgical history on file.  No Known Allergies Social History   Socioeconomic History   Marital status: Married    Spouse name: Not on file   Number of children: Not on file   Years of education: Not on file   Highest education level: Not on file  Occupational History   Not on file  Tobacco Use   Smoking status: Never   Smokeless tobacco: Never  Substance and Sexual Activity   Alcohol use: No   Drug use: No   Sexual activity: Not on file  Other Topics Concern   Not on file  Social History Narrative   Not on file   Social Determinants of Health   Financial Resource Strain: Not on file  Food Insecurity: Not on file  Transportation Needs: Not on file  Physical Activity: Not on file  Stress: Not on file  Social Connections: Not on file  Intimate Partner Violence: Not on file   Patient is married with 3 children.  He works as a Software engineer. Family History  Problem Relation Age of Onset   Cancer Father        prostate   Alzheimer's disease Father    Heart disease Maternal Uncle    Heart disease Maternal Grandmother    Heart disease Maternal Grandfather    Alzheimer's disease Paternal Aunt    Alzheimer's disease Paternal Uncle      Review of Systems  All other systems reviewed and are negative.      Objective:   Physical Exam Vitals reviewed.  Constitutional:      General: He is not in acute distress.    Appearance: He is well-developed. He is not diaphoretic.  HENT:     Head: Normocephalic and atraumatic.     Right Ear: Tympanic membrane, ear canal and external ear normal.     Left Ear: Tympanic membrane, ear canal and external ear normal.     Nose: Nose normal. No congestion or rhinorrhea.     Mouth/Throat:     Pharynx: No oropharyngeal exudate.  Eyes:     General: No scleral icterus.       Right eye: No discharge.  Left eye: No  discharge.     Extraocular Movements: Extraocular movements intact.     Conjunctiva/sclera: Conjunctivae normal.     Pupils: Pupils are equal, round, and reactive to light.  Neck:     Thyroid: No thyromegaly.     Vascular: No JVD.     Trachea: No tracheal deviation.  Cardiovascular:     Rate and Rhythm: Normal rate and regular rhythm.     Heart sounds: Normal heart sounds. No murmur heard.    No friction rub. No gallop.  Pulmonary:     Effort: Pulmonary effort is normal. No respiratory distress.     Breath sounds: Normal breath sounds. No stridor. No wheezing or rales.  Chest:     Chest wall: No tenderness.  Abdominal:     General: Bowel sounds are normal. There is no distension.     Palpations: Abdomen is soft. There is no mass.     Tenderness: There is no abdominal tenderness. There is no guarding or rebound.  Genitourinary:    Penis: Normal.      Testes: Normal.  Musculoskeletal:        General: No tenderness or deformity. Normal range of motion.     Cervical back: Normal range of motion and neck supple.  Lymphadenopathy:     Cervical: No cervical adenopathy.  Skin:    General: Skin is warm.     Coloration: Skin is not pale.     Findings: No erythema or rash.  Neurological:     General: No focal deficit present.     Mental Status: He is alert and oriented to person, place, and time. Mental status is at baseline.     Cranial Nerves: No cranial nerve deficit.     Sensory: No sensory deficit.     Motor: No weakness or abnormal muscle tone.     Coordination: Coordination normal.     Gait: Gait normal.     Deep Tendon Reflexes: Reflexes are normal and symmetric. Reflexes normal.  Psychiatric:        Behavior: Behavior normal.        Thought Content: Thought content normal.        Judgment: Judgment normal.           Assessment & Plan:  Flu vaccine need - Plan: Flu Vaccine QUAD 6+ mos PF IM (Fluarix Quad PF)  Atypical mole - Plan: Pathology Report  ([facility])  General medical exam Patient received his flu shot today.  He defers a COVID shot and tetanus shot.  Blood pressure is outstanding.  The remainder of his lab work is outstanding.  Cholesterol slightly elevated but with a normal/0 coronary calcium score I see no reason to put the patient on a statin.  I did perform a shave biopsy of the lesion on his abdomen and sent this to pathology and labeled container.  Await results of the biopsy report.  Hemostasis was achieved with Drysol and a Band-Aid.

## 2022-08-16 LAB — PATHOLOGY REPORT

## 2022-08-16 LAB — TISSUE SPECIMEN

## 2023-03-22 DIAGNOSIS — M79644 Pain in right finger(s): Secondary | ICD-10-CM | POA: Diagnosis not present

## 2023-05-22 ENCOUNTER — Other Ambulatory Visit (HOSPITAL_BASED_OUTPATIENT_CLINIC_OR_DEPARTMENT_OTHER): Payer: Self-pay

## 2023-05-22 MED ORDER — CYCLOBENZAPRINE HCL 10 MG PO TABS
10.0000 mg | ORAL_TABLET | Freq: Three times a day (TID) | ORAL | 1 refills | Status: DC | PRN
  Filled 2023-05-22: qty 30, 10d supply, fill #0

## 2023-05-22 MED ORDER — PREDNISONE 5 MG PO TABS
ORAL_TABLET | ORAL | 1 refills | Status: DC
  Filled 2023-05-22: qty 21, 6d supply, fill #0

## 2023-06-13 ENCOUNTER — Other Ambulatory Visit (HOSPITAL_BASED_OUTPATIENT_CLINIC_OR_DEPARTMENT_OTHER): Payer: Self-pay

## 2023-06-13 MED ORDER — PREDNISONE 5 MG PO TABS
ORAL_TABLET | ORAL | 1 refills | Status: DC
Start: 2023-06-13 — End: 2023-11-07
  Filled 2023-06-13: qty 21, 5d supply, fill #0

## 2023-06-18 DIAGNOSIS — M5416 Radiculopathy, lumbar region: Secondary | ICD-10-CM | POA: Insufficient documentation

## 2023-10-10 IMAGING — CT CT CARDIAC CORONARY ARTERY CALCIUM SCORE
3 series · 14 of 20 positions shown, 15 images · non-contrast
Comparison: None Available.
COMPARISON: None Available.

Addendum:
CLINICAL DATA: This over-read does not include interpretation of
cardiac or coronary anatomy or pathology. The coronary calcium score
interpretation by the cardiologist is attached.
CLINICAL DATA: Cardiovascular Disease Risk stratification

EXAM:
Coronary Calcium Score
TECHNIQUE: A gated, non-contrast computed tomography scan of the heart was
performed using 3mm slice thickness. Axial images were analyzed on a
dedicated workstation. Calcium scoring of the coronary arteries was
performed using the Agatston method.

[Series 3: ax ca scr 70% (id) · axial · 0.35mm/px · z∈[-204,-100]mm · 6 of 74 slices shown]
[im 11/74  vessel]
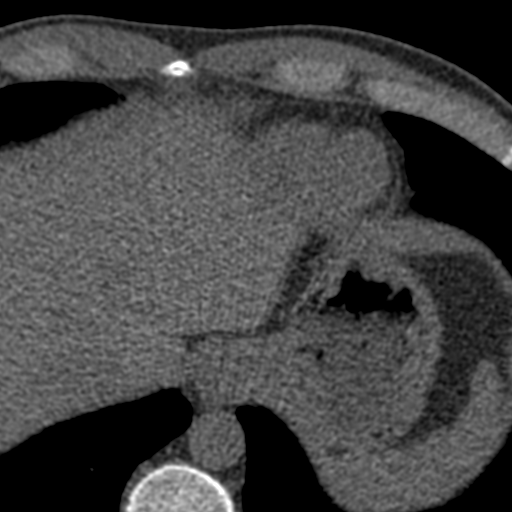
[im 21/74  vessel]
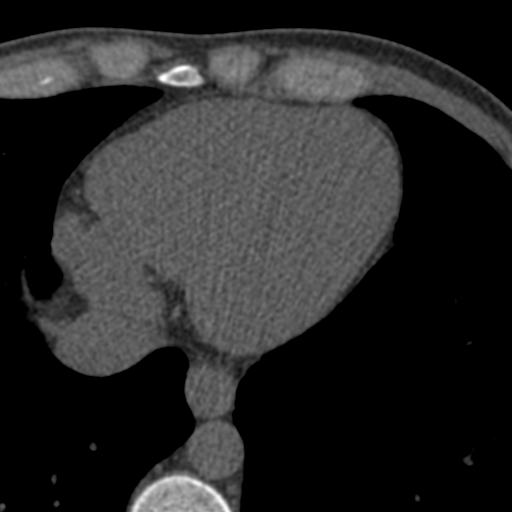
[im 32/74  vessel]
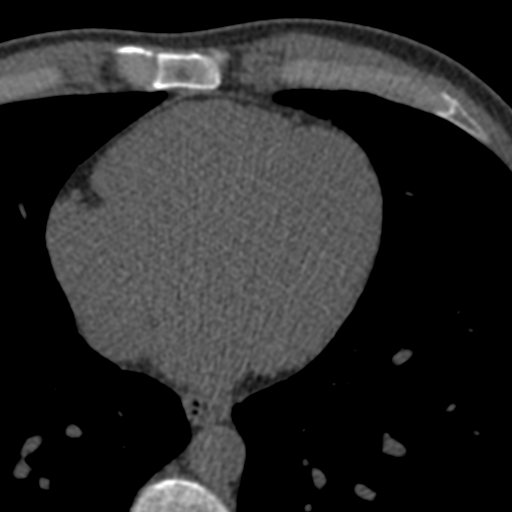
[im 42/74  vessel]
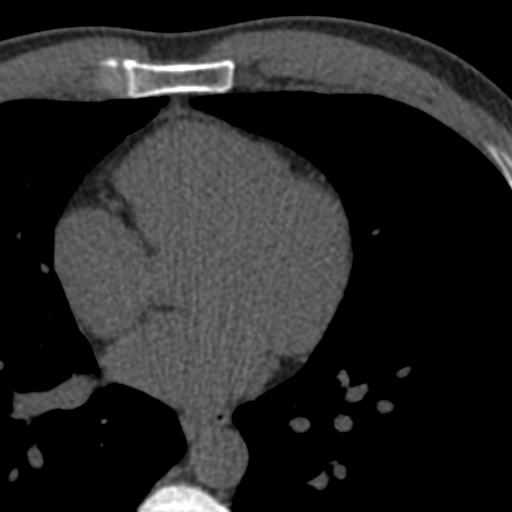
[im 53/74  vessel]
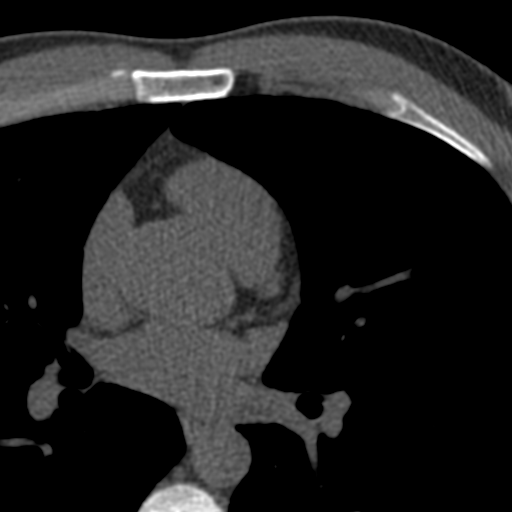
[im 63/74  vessel]
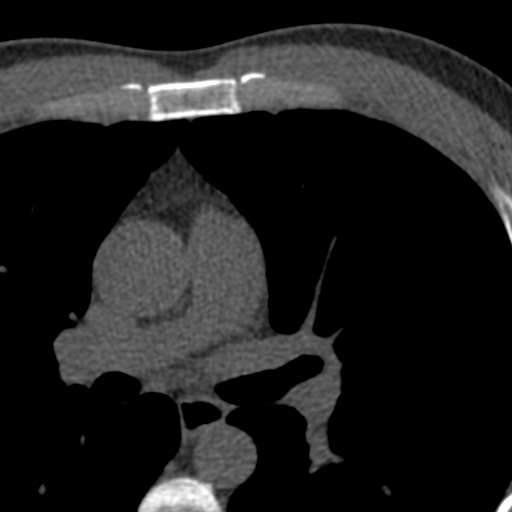

[Series 4: ax st · axial · 0.64mm/px · z∈[-196,-108]mm · 4 of 49 slices shown, 5 images]
[im 10/49  vessel]
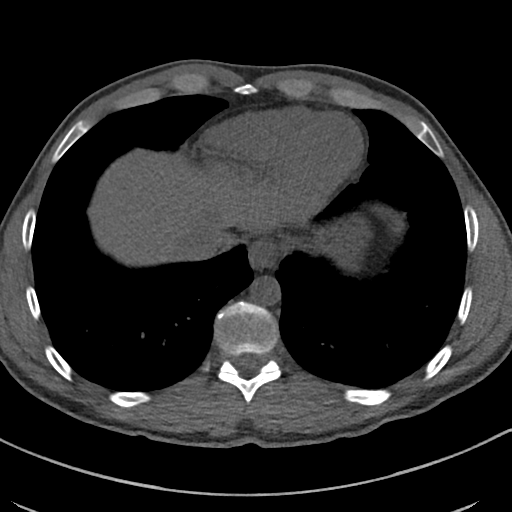
[im 10/49  lung]
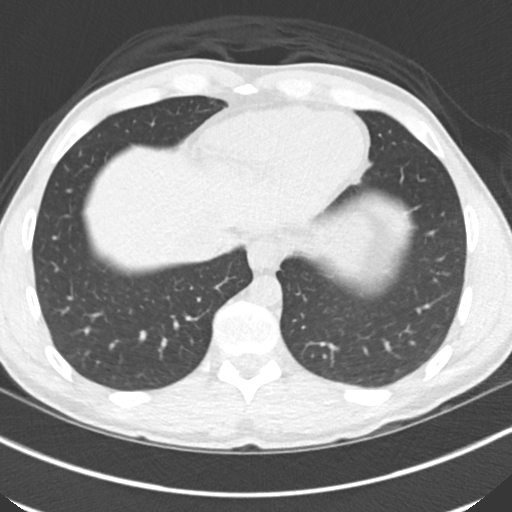
[im 20/49  vessel]
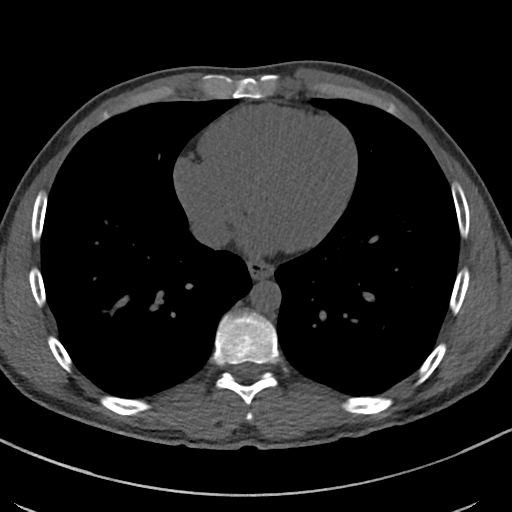
[im 29/49  vessel]
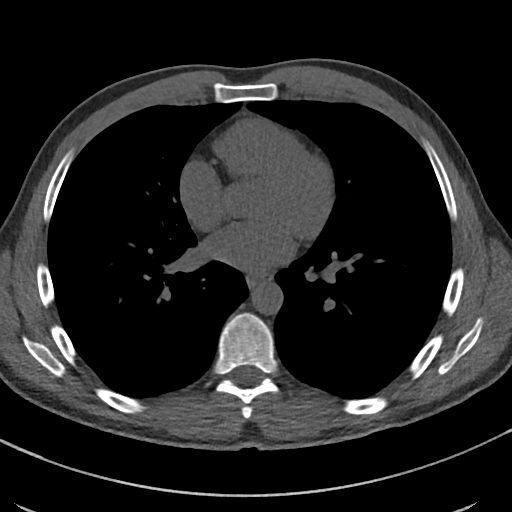
[im 39/49  vessel]
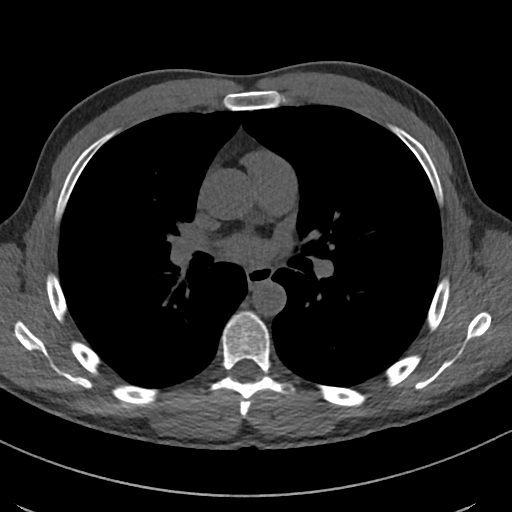

[Series 5: ax lung · axial · 0.64mm/px · z∈[-196,-108]mm · 4 of 49 slices shown]
[im 10/49  lung]
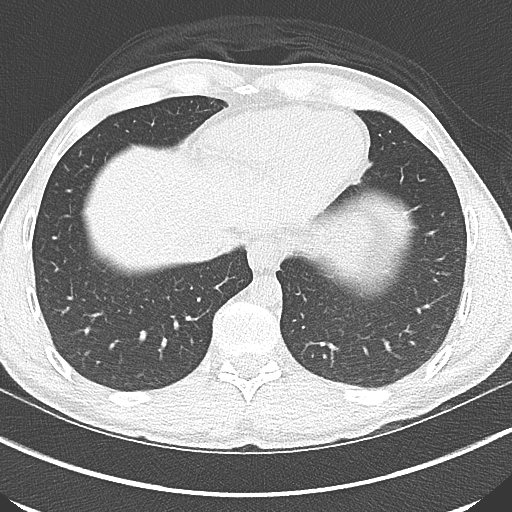
[im 20/49  lung]
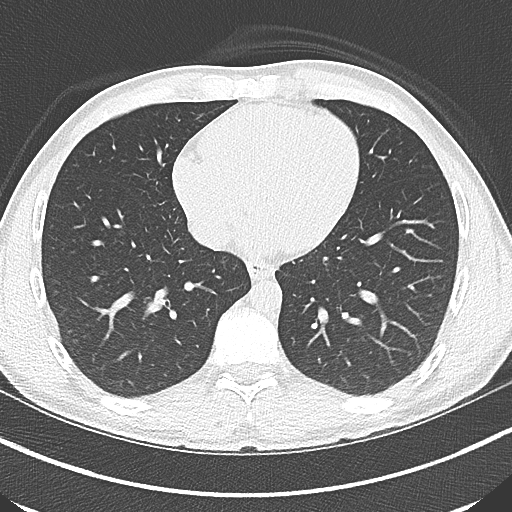
[im 29/49  lung]
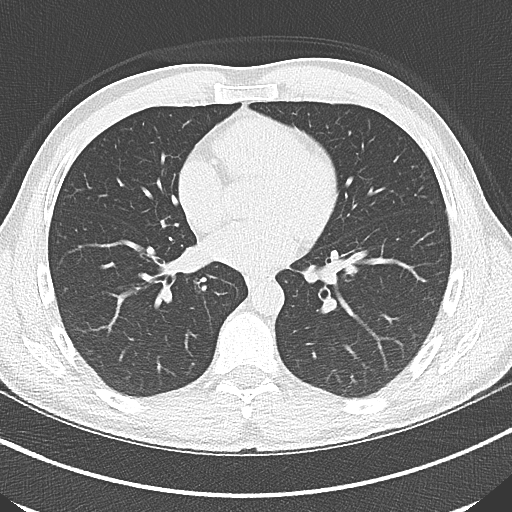
[im 39/49  lung]
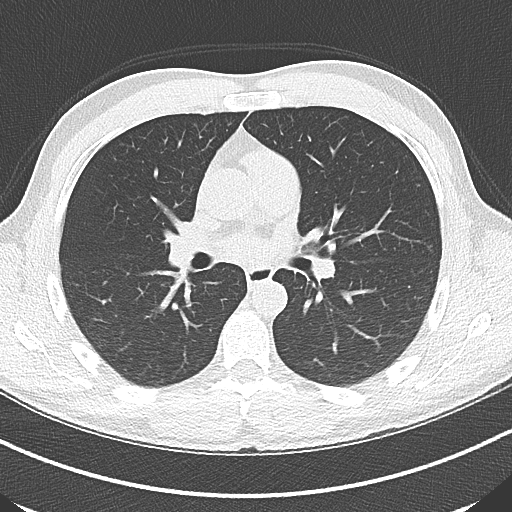

[14 of 20 positions shown; findings below may reference images not displayed]

FINDINGS: Within the visualized portions of the thorax there are no suspicious
appearing pulmonary nodules or masses, there is no acute
consolidative airspace disease, no pleural effusions, no
pneumothorax and no lymphadenopathy. Visualized portions of the
upper abdomen are unremarkable. There are no aggressive appearing
lytic or blastic lesions noted in the visualized portions of the
skeleton.
IMPRESSION: 1. No significant incidental noncardiac findings are noted.
FINDINGS: Coronary arteries: Normal origins.

Coronary Calcium Score:

Left main: 0

Left anterior descending artery: 0

Left circumflex artery: 0

Right coronary artery: 0

Total: 0

Percentile: 0

Pericardium: Normal.

Aorta: Normal caliber.

Non-cardiac: See separate report from [REDACTED].
IMPRESSION: Coronary calcium score of 0. This is a low risk study.



If CAC=0, it is reasonable to withhold statin therapy and reassess
in 5 to 10 years, as long as higher risk conditions are absent
(diabetes mellitus, family history of premature CHD in first degree
relatives (males <55 years; females <65 years), cigarette smoking,
or LDL >=190 mg/dL).

If CAC is 1 to 99, it is reasonable to initiate statin therapy for
patients >=55 years of age.

If CAC is >=100 or >=75th percentile, it is reasonable to initiate
statin therapy at any age.

Cardiology referral should be considered for patients with CAC
scores >=400 or >=75th percentile.

*5106 AHA/ACC/AACVPR/AAPA/ABC/LIMEN/NOMASIBULELE/KLPIGBB/[place]/GIPSZ/ILLARION/JONARDA
Guideline on the Management of Blood Cholesterol: A Report of the
American College of Cardiology/American Heart Association Task Force
on Clinical Practice Guidelines. J Am Coll Cardiol.
2939;73(24):1857-1397.

*** End of Addendum ***
FINDINGS: Within the visualized portions of the thorax there are no suspicious
appearing pulmonary nodules or masses, there is no acute
consolidative airspace disease, no pleural effusions, no
pneumothorax and no lymphadenopathy. Visualized portions of the
upper abdomen are unremarkable. There are no aggressive appearing
lytic or blastic lesions noted in the visualized portions of the
skeleton.
IMPRESSION: 1. No significant incidental noncardiac findings are noted.

## 2023-11-06 ENCOUNTER — Other Ambulatory Visit: Payer: 59

## 2023-11-06 ENCOUNTER — Encounter: Payer: 59 | Admitting: Family Medicine

## 2023-11-07 ENCOUNTER — Ambulatory Visit: Payer: Managed Care, Other (non HMO) | Admitting: Family Medicine

## 2023-11-07 VITALS — BP 118/60 | HR 71 | Temp 97.5°F | Ht 67.0 in | Wt 163.8 lb

## 2023-11-07 DIAGNOSIS — E7800 Pure hypercholesterolemia, unspecified: Secondary | ICD-10-CM

## 2023-11-07 DIAGNOSIS — Z0001 Encounter for general adult medical examination with abnormal findings: Secondary | ICD-10-CM | POA: Diagnosis not present

## 2023-11-07 DIAGNOSIS — M6289 Other specified disorders of muscle: Secondary | ICD-10-CM

## 2023-11-07 DIAGNOSIS — Z0000 Encounter for general adult medical examination without abnormal findings: Secondary | ICD-10-CM

## 2023-11-07 NOTE — Progress Notes (Signed)
 Subjective:    Patient ID: Carl Newton, male    DOB: 1980-12-16, 43 y.o.   MRN: [id number]  HPI Patient is a very pleasant 43 year old Caucasian gentleman with a history of paroxysmal atrial fibrillation.  He also has a history of urinary retention secondary to pelvic floor dysfunction.  He continues to have issues with pelvic floor dysfunction including pain after intercourse, urinary retention, and even some erectile dysfunction.  Patient is interested in trying physical therapy through the urologist office again.  I will be happy to try to arrange that for him.  He is already had his flu shot.  We discussed RSV and COVID which he politely declines.  His tetanus shot was in 2013.  He believes he had a tetanus shot in 2016 at another location so he politely declines this.  Otherwise he is doing well with no concerns.     Past Medical History:  Diagnosis Date   Atrial fibrillation [facility])    Urinary retention    No past surgical history on file.  No Known Allergies Social History   Socioeconomic History   Marital status: Married    Spouse name: Not on file   Number of children: Not on file   Years of education: Not on file   Highest education level: Not on file  Occupational History   Not on file  Tobacco Use   Smoking status: Never   Smokeless tobacco: Never  Substance and Sexual Activity   Alcohol use: No   Drug use: No   Sexual activity: Not on file  Other Topics Concern   Not on file  Social History Narrative   Not on file   Social Drivers of Health   Financial Resource Strain: Not on file  Food Insecurity: Not on file  Transportation Needs: Not on file  Physical Activity: Not on file  Stress: Not on file  Social Connections: Not on file  Intimate Partner Violence: Not on file   Patient is married with 3 children.  He works as a teacher, early years/pre. Family History  Problem Relation Age of Onset   Cancer Father        prostate   Alzheimer's disease Father     Heart disease Maternal Uncle    Heart disease Maternal Grandmother    Heart disease Maternal Grandfather    Alzheimer's disease Paternal Aunt    Alzheimer's disease Paternal Uncle      Review of Systems  All other systems reviewed and are negative.      Objective:   Physical Exam Vitals reviewed.  Constitutional:      General: He is not in acute distress.    Appearance: He is well-developed. He is not diaphoretic.  HENT:     Head: Normocephalic and atraumatic.     Right Ear: Tympanic membrane, ear canal and external ear normal.     Left Ear: Tympanic membrane, ear canal and external ear normal.     Nose: Nose normal. No congestion or rhinorrhea.     Mouth/Throat:     Pharynx: No oropharyngeal exudate.  Eyes:     General: No scleral icterus.       Right eye: No discharge.        Left eye: No discharge.     Extraocular Movements: Extraocular movements intact.     Conjunctiva/sclera: Conjunctivae normal.     Pupils: Pupils are equal, round, and reactive to light.  Neck:     Thyroid: No thyromegaly.  Vascular: No JVD.     Trachea: No tracheal deviation.  Cardiovascular:     Rate and Rhythm: Normal rate and regular rhythm.     Heart sounds: Normal heart sounds. No murmur heard.    No friction rub. No gallop.  Pulmonary:     Effort: Pulmonary effort is normal. No respiratory distress.     Breath sounds: Normal breath sounds. No stridor. No wheezing or rales.  Chest:     Chest wall: No tenderness.  Abdominal:     General: Bowel sounds are normal. There is no distension.     Palpations: Abdomen is soft. There is no mass.     Tenderness: There is no abdominal tenderness. There is no guarding or rebound.  Genitourinary:    Penis: Normal.      Testes: Normal.  Musculoskeletal:        General: No tenderness or deformity. Normal range of motion.     Cervical back: Normal range of motion and neck supple.  Lymphadenopathy:     Cervical: No cervical adenopathy.   Skin:    General: Skin is warm.     Coloration: Skin is not pale.     Findings: No erythema or rash.  Neurological:     General: No focal deficit present.     Mental Status: He is alert and oriented to person, place, and time. Mental status is at baseline.     Cranial Nerves: No cranial nerve deficit.     Sensory: No sensory deficit.     Motor: No weakness or abnormal muscle tone.     Coordination: Coordination normal.     Gait: Gait normal.     Deep Tendon Reflexes: Reflexes are normal and symmetric. Reflexes normal.  Psychiatric:        Behavior: Behavior normal.        Thought Content: Thought content normal.        Judgment: Judgment normal.           Assessment & Plan:  Pelvic floor dysfunction - Plan: Ambulatory referral to Physical Therapy  Pure hypercholesterolemia - Plan: CBC with Differential/Platelet, COMPLETE METABOLIC PANEL WITH GFR, Lipid panel  General medical exam Physical exam is complete normal.  Blood sugar is excellent.  I will check a CBC and CMP and lipid panel.  Coronary artery calcium score was 0.  Therefore I would not treat his cholesterol unless his LDL had risen greater than 190.  Recommended a tetanus shot.  Flu shot is up-to-date.  Colon cancer screening is not due until age 64.  I will try to arrange physical be through [facility] urology for pelvic floor dysfunction

## 2023-12-05 NOTE — Therapy (Signed)
 OUTPATIENT PHYSICAL THERAPY MALE PELVIC EVALUATION   Patient Name: Carl Newton MRN: [id number] DOB:02-04-1981, 43 y.o., male Today's Date: 12/06/2023  END OF SESSION:  PT End of Session - 12/06/23 0935     Visit Number 1    Date for PT Re-Evaluation 06/04/24    Authorization Type [employer]    Authorization - Visit Number 1    Authorization - Number of Visits 30    PT Start Time 0930    PT Stop Time 1010    PT Time Calculation (min) 40 min    Activity Tolerance Patient tolerated treatment well    Behavior During Therapy WFL for tasks assessed/performed             Past Medical History:  Diagnosis Date   Atrial fibrillation (HCC)    Urinary retention    History reviewed. No pertinent surgical history. Patient Active Problem List   Diagnosis Date Noted   Paroxysmal atrial fibrillation (HCC) 06/29/2021    PCP: Duanne Butler DASEN, MD  REFERRING PROVIDER: Duanne Butler DASEN, MD   REFERRING DIAG: M62.89 (ICD-10-CM) - Pelvic floor dysfunction   THERAPY DIAG:  Cramp and spasm  Other lack of coordination  Rationale for Evaluation and Treatment: Rehabilitation  ONSET DATE: 2018  SUBJECTIVE:                                                                                                                                                                                           SUBJECTIVE STATEMENT: Urinary hesitancy, urinary dribbling. I tried pelvic PT there. Did not see a huge change. Trouble maintaining erection and more challenging in standing. Pressure in perineum and close the anus. Stiff in the pelvic area; I had the manual work and may of helped. Biofeedback was helpful.  Fluid intake: soda, water and sweet tea  PAIN:  Are you having pain? Yes: NPRS scale: 5/10 Pain location: back middle of the perineum Pain description: sensation like he needs to evacuate his bowels, cramping Aggravating factors: after ejaculation Relieving factors: massage  PAIN:  Are  you having pain? Yes NPRS scale: 2/10 Pain location:  low back  Pain type: dull Pain description: intermittent   Aggravating factors: sitting too long, bending over, lifting Relieving factors: extend lumbar, pillow in lumbar in sitting  PRECAUTIONS: None  RED FLAGS: None   WEIGHT BEARING RESTRICTIONS: No  FALLS:  Has patient fallen in last 6 months? 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   URINATION: Pain with urination: No Fully empty bladder: No, sometimes has the post void dribble Stream: Weak, delayed starting, sits down to urinate Urgency: Yes: at times, when he wakes up does not have to urinate immediately Frequency: average 3-4 hours; 0 nighttime Leakage:  post void dribble   INTERCOURSE: Pain with intercourse:  no and pressure in the perineum after ejaculation Delayed ejaculation and less force of the ejaculation   OBJECTIVE:  Note: Objective measures were completed at Evaluation unless otherwise noted.  DIAGNOSTIC FINDINGS:  none    COGNITION: Overall cognitive status: Within functional limits for tasks assessed     SENSATION: Light touch: Appears intact Proprioception: Appears intact   POSTURE: No Significant postural limitations  PELVIC ALIGNMENT:  LUMBARAROM/PROM:  A/PROM A/PROM  eval  Extension Decreased by 25%  Right lateral flexion Decreased by 25%  Right rotation Decreased by 25%   (Blank rows = not tested)  LOWER EXTREMITY AROM/PROM:  A/PROM Right eval Left eval  Hip internal rotation 30 15   (Blank rows = not tested)    PALPATION: GENERAL bulging of the lower abdominals with contraction, decreased movement of the  lower rib cage              External Perineal Exam tightness in the perineal body, along the ischiocavernosus, bulbocavernosus, and external anal sphincter              Internal Pelvic Floor tenderness located in the puborectalis, and iliococcygeus. Not able to bring the therapist finger further into the rectum due to the external anal sphincter was too tight.  Patient confirms identification and approves PT to assess internal pelvic floor and treatment Yes; strength was not assessed at this time due to increased tone of the muscles.  PELVIC MMT:   MMT eval  Internal Anal Sphincter   External Anal Sphincter   Puborectalis   Diastasis Recti   (Blank rows = not tested)  TONE: increased  TODAY'S TREATMENT:                                                                                                                              DATE: 12/06/23  EVAL See below   PATIENT EDUCATION:  12/06/23 Education details: gave patient information on rectal wand, you tube video on perineal massage, and information on dry needling Person educated: Patient Education method: Explanation, Demonstration, Tactile cues, Verbal cues, and Handouts Education comprehension: verbalized understanding, returned demonstration, verbal cues required, tactile cues required, and needs further education  HOME EXERCISE PROGRAM: See above.   ASSESSMENT:  CLINICAL IMPRESSION: Patient is a 43 y.o. male who was seen today for physical therapy evaluation and treatment for pelvic floor dysfunction. Patient reports he started to have issues in 2018. He started to have difficulty with erections and weaker ejaculations.  He will have perineal  pain at level 5/10 in the perineal area  that makes him feel like he needs to have a bowel movement. Patient reports he has post void dribbling. He can have a delayed urine stream and will have to sit on the commode. He has decreased hip and lumbar ROM. He has tightness in the  ischiococcygeus, bulbocavernosus, perineal body and external anal sphincter. He has tenderness located in the puborectalis and iliococcygeus. He has decreased mobility of the lower rib cage and diaphragm. Patient reports lumbar pain at level 2/10 from a herniated disc. Patient will benefit from skilled therapy to reduce tension of the pelvic floor to improve function.   OBJECTIVE IMPAIRMENTS: decreased coordination, decreased ROM, increased fascial restrictions, increased muscle spasms, and pain.   ACTIVITY LIMITATIONS: sitting, standing, continence, toileting, and locomotion level  PARTICIPATION LIMITATIONS: interpersonal relationship, community activity, and occupation  PERSONAL FACTORS: Time since onset of injury/illness/exacerbation are also affecting patient's functional outcome.   REHAB POTENTIAL: Excellent  CLINICAL DECISION MAKING: Evolving/moderate complexity  EVALUATION COMPLEXITY: Moderate   GOALS: Goals reviewed with patient? Yes  SHORT TERM GOALS: Target date: 01/03/24  Patient educated on perineal massage to reduce tension in the pelvic floor muscles.  Baseline: Goal status: INITIAL  2.  Patient is able to perform diaphragmatic breathing to relax the pelvic floor during pain and urinating.  Baseline:  Goal status: INITIAL  3.  Patient independent with initial HEP for back and hip flexibility.  Baseline:  Goal status: INITIAL  4.  Patient is able to contract the lower abdominals equally with the upper.  Baseline:  Goal status: INITIAL   LONG TERM GOALS: Target date: 06/04/24  Patient independent with advanced HEP for core and pelvic floor.  Baseline:  Goal status: INITIAL  2.  Patient is able to ejaculate without delay or pain due to improved lengthening of the pelvic floor muscles.  Baseline:  Goal status: INITIAL  3.  Patient is able to urinate without post void dribble due to the ability to engage the lower abdomen to fully empty the bladder.  Baseline:   Goal status: INITIAL  4.  Patient is able to maintain an erection for the time he is intimate with his wife due to increased endurance of the pelvic floor muscles.  Baseline:  Goal status: INITIAL   PLAN:  PT FREQUENCY: 1-2x/week  PT DURATION: 6 months  PLANNED INTERVENTIONS: 97110-Therapeutic exercises, 97530- Therapeutic activity, 97112- Neuromuscular re-education, 97535- Self Care, [id number]- Manual therapy, [id number]- Electrical stimulation (unattended), 97035- Ultrasound, Patient/Family education, Taping, Dry Needling, Joint manipulation, Spinal mobilization, Cryotherapy, Moist heat, and Biofeedback  PLAN FOR NEXT SESSION: dry needling to the pelvic floor muscles, diaphragmatic breathing, manual work to the pelvic floor, lower abdominal engagement   Channing Pereyra, PT 12/06/23 1:08 PM

## 2023-12-06 ENCOUNTER — Encounter: Payer: Self-pay | Admitting: Physical Therapy

## 2023-12-06 ENCOUNTER — Ambulatory Visit: Payer: Managed Care, Other (non HMO) | Attending: Family Medicine | Admitting: Physical Therapy

## 2023-12-06 ENCOUNTER — Other Ambulatory Visit: Payer: Self-pay

## 2023-12-06 DIAGNOSIS — M6289 Other specified disorders of muscle: Secondary | ICD-10-CM | POA: Diagnosis not present

## 2023-12-06 DIAGNOSIS — R278 Other lack of coordination: Secondary | ICD-10-CM | POA: Diagnosis present

## 2023-12-06 DIAGNOSIS — R252 Cramp and spasm: Secondary | ICD-10-CM | POA: Diagnosis present

## 2023-12-06 NOTE — Patient Instructions (Signed)

## 2023-12-11 ENCOUNTER — Encounter: Payer: Self-pay | Admitting: Physical Therapy

## 2023-12-11 ENCOUNTER — Ambulatory Visit: Payer: Managed Care, Other (non HMO) | Admitting: Physical Therapy

## 2023-12-11 DIAGNOSIS — R252 Cramp and spasm: Secondary | ICD-10-CM

## 2023-12-11 DIAGNOSIS — R278 Other lack of coordination: Secondary | ICD-10-CM

## 2023-12-11 NOTE — Therapy (Signed)
 OUTPATIENT PHYSICAL THERAPY MALE PELVIC TREATMENT   Patient Name: Carl Newton MRN: [id number] DOB:05/04/1981, 43 y.o., male Today's Date: 12/11/2023  END OF SESSION:  PT End of Session - 12/11/23 0931     Visit Number 2    Date for PT Re-Evaluation 06/04/24    Authorization Type [employer]    Authorization - Visit Number 2    Authorization - Number of Visits 30    PT Start Time 0930    PT Stop Time 1010    PT Time Calculation (min) 40 min    Activity Tolerance Patient tolerated treatment well    Behavior During Therapy WFL for tasks assessed/performed             Past Medical History:  Diagnosis Date   Atrial fibrillation (HCC)    Urinary retention    History reviewed. No pertinent surgical history. Patient Active Problem List   Diagnosis Date Noted   Paroxysmal atrial fibrillation (HCC) 06/29/2021    PCP: Austine Lefort, MD  REFERRING PROVIDER: Austine Lefort, MD   REFERRING DIAG: M62.89 (ICD-10-CM) - Pelvic floor dysfunction   THERAPY DIAG:  Cramp and spasm  Other lack of coordination  Rationale for Evaluation and Treatment: Rehabilitation  ONSET DATE: 2018  SUBJECTIVE:                                                                                                                                                                                           SUBJECTIVE STATEMENT: No changes since eval.  Fluid intake: soda, water and sweet tea  PAIN:  Are you having pain? Yes: NPRS scale: 5/10 Pain location: back middle of the perineum Pain description: sensation like he needs to evacuate his bowels, cramping Aggravating factors: after ejaculation Relieving factors: massage  PAIN:  Are you having pain? Yes NPRS scale: 2/10 Pain location:  low back  Pain type: dull Pain description: intermittent   Aggravating factors: sitting too long, bending over, lifting Relieving factors: extend lumbar, pillow in lumbar in sitting  PRECAUTIONS:  None  RED FLAGS: None   WEIGHT BEARING RESTRICTIONS: No  FALLS:  Has patient fallen in last 6 months? 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   URINATION: Pain with urination: No Fully empty bladder: No, sometimes has the post void dribble Stream: Weak, delayed starting, sits down  to urinate Urgency: Yes: at times, when he wakes up does not have to urinate immediately Frequency: average 3-4 hours; 0 nighttime Leakage:  post void dribble   INTERCOURSE: Pain with intercourse:  no and pressure in the perineum after ejaculation Delayed ejaculation and less force of the ejaculation   OBJECTIVE:  Note: Objective measures were completed at Evaluation unless otherwise noted.  DIAGNOSTIC FINDINGS:  none    COGNITION: Overall cognitive status: Within functional limits for tasks assessed     SENSATION: Light touch: Appears intact Proprioception: Appears intact   POSTURE: No Significant postural limitations  PELVIC ALIGNMENT:  LUMBARAROM/PROM:  A/PROM A/PROM  eval  Extension Decreased by 25%  Right lateral flexion Decreased by 25%  Right rotation Decreased by 25%   (Blank rows = not tested)  LOWER EXTREMITY AROM/PROM:  A/PROM Right eval Left eval  Hip internal rotation 30 15   (Blank rows = not tested)    PALPATION: GENERAL bulging of the lower abdominals with contraction, decreased movement of the lower rib cage              External Perineal Exam tightness in the perineal body, along the ischiocavernosus, bulbocavernosus, and external anal sphincter              Internal Pelvic Floor tenderness located in the puborectalis, and iliococcygeus. Not able  to bring the therapist finger further into the rectum due to the external anal sphincter was too tight.  Patient confirms identification and approves PT to assess internal pelvic floor and treatment Yes; strength was not assessed at this time due to increased tone of the muscles.  PELVIC MMT:   MMT eval  Internal Anal Sphincter   External Anal Sphincter   Puborectalis   Diastasis Recti   (Blank rows = not tested)  TONE: increased  TODAY'S TREATMENT:      12/11/23 Manual: Soft tissue mobilization: To assess for dry needling Manual work to the perineal body, bulbocavernosus, ischiocavernosus, obturator internist to elongate after dry needling Trigger Point Dry Needling  Initial Treatment: Pt instructed on Dry Needling rational, procedures, and possible side effects. Pt instructed to expect mild to moderate muscle soreness later in the day and/or into the next day.  Pt instructed in methods to reduce muscle soreness. Pt instructed to continue prescribed HEP. Because Dry Needling was performed over or adjacent to a lung field, pt was educated on S/S of pneumothorax and to seek immediate medical attention should they occur.  Patient was educated on signs and symptoms of infection and other risk factors and advised to seek medical attention should they occur.  Patient verbalized understanding of these instructions and education.   Patient Verbal Consent Given: Yes Education Handout Provided: Yes Muscles Treated: ischiocavernosus, bulbocavernosus, perineal body Electrical Stimulation Performed: No Treatment Response/Outcome: elongation of muscle and trigger point response Exercises: Stretches/mobility: Happy baby with diaphragmatic breathing Quadruped with hips internally rotated moving back and forth Pigeon pose holding 30 sec bil.  Z stretch moving up and down 5 times  Sit on ball and massage the pelvic floor  DATE: 12/06/23  EVAL See below   PATIENT EDUCATION:  12/11/23 Education details: gave patient information on rectal wand, you tube video on perineal massage, and information on dry needling, Access Code: [email or web address] Person educated: Patient Education method: Explanation, Demonstration, Tactile cues, Verbal cues, and Handouts Education comprehension: verbalized understanding, returned demonstration, verbal cues required, tactile cues required, and needs further education  HOME EXERCISE PROGRAM: 12/11/23 Access Code: [email or web address] URL: [email or web address] Date: 12/11/2023 Prepared by: Marsha Skeen  Exercises - Happy Baby with Pelvic Floor Lengthening  - 1 x daily - 7 x weekly - 1 sets - 1 reps - Quadruped Rocking Slow  - 1 x daily - 7 x weekly - 1 sets - 10 reps - Pigeon Pose  - 1 x daily - 7 x weekly - 1 sets - 1 reps - 30 sec hold See above.   ASSESSMENT:  CLINICAL IMPRESSION: Patient is a 43 y.o. male who was seen today for physical therapy  treatment for pelvic floor dysfunction. Patient responded well to the dry needling with good trigger point response and felt the muscles relaxed. Patient understands stretches to do to lengthen the pelvic floor muscles. Patient is doing the pelvic floor muscle massage at home. He will wait to order the pelvic wand to see how the dry needling does.  Patient will benefit from skilled therapy to reduce tension of the pelvic floor to improve function.   OBJECTIVE IMPAIRMENTS: decreased coordination, decreased ROM, increased fascial restrictions, increased muscle spasms, and pain.   ACTIVITY LIMITATIONS: sitting, standing, continence, toileting, and locomotion level  PARTICIPATION LIMITATIONS: interpersonal relationship, community activity, and occupation  PERSONAL FACTORS: Time since onset of injury/illness/exacerbation are also affecting patient's functional outcome.   REHAB POTENTIAL:  Excellent  CLINICAL DECISION MAKING: Evolving/moderate complexity  EVALUATION COMPLEXITY: Moderate   GOALS: Goals reviewed with patient? Yes  SHORT TERM GOALS: Target date: 01/03/24  Patient educated on perineal massage to reduce tension in the pelvic floor muscles.  Baseline: Goal status: Met 12/11/23  2.  Patient is able to perform diaphragmatic breathing to relax the pelvic floor during pain and urinating.  Baseline:  Goal status: INITIAL  3.  Patient independent with initial HEP for back and hip flexibility.  Baseline:  Goal status: INITIAL  4.  Patient is able to contract the lower abdominals equally with the upper.  Baseline:  Goal status: INITIAL   LONG TERM GOALS: Target date: 06/04/24  Patient independent with advanced HEP for core and pelvic floor.  Baseline:  Goal status: INITIAL  2.  Patient is able to ejaculate without delay or pain due to improved lengthening of the pelvic floor muscles.  Baseline:  Goal status: INITIAL  3.  Patient is able to urinate without post void dribble due to the ability to engage the lower abdomen to fully empty the bladder.  Baseline:  Goal status: INITIAL  4.  Patient is able to maintain an erection for the time he is intimate with his wife due to increased endurance of the pelvic floor muscles.  Baseline:  Goal status: INITIAL   PLAN:  PT FREQUENCY: 1-2x/week  PT DURATION: 6 months  PLANNED INTERVENTIONS: 97110-Therapeutic exercises, 97530- Therapeutic activity, 97112- Neuromuscular re-education, 97535- Self Care, 16109- Manual therapy, 97014- Electrical stimulation (unattended), 97035- Ultrasound, Patient/Family education, Taping, Dry Needling, Joint manipulation, Spinal mobilization, Cryotherapy, Moist heat, and Biofeedback  PLAN FOR NEXT SESSION: dry needling to the pelvic floor muscles, diaphragmatic breathing, manual work to the pelvic floor, lower abdominal engagement   [place]anan  Martina Sledge, PT 12/11/23 10:17 AM

## 2023-12-20 ENCOUNTER — Encounter: Payer: Managed Care, Other (non HMO) | Admitting: Physical Therapy

## 2023-12-29 ENCOUNTER — Ambulatory Visit: Payer: Managed Care, Other (non HMO) | Admitting: Physical Therapy

## 2023-12-29 ENCOUNTER — Encounter: Payer: Self-pay | Admitting: Physical Therapy

## 2023-12-29 DIAGNOSIS — R252 Cramp and spasm: Secondary | ICD-10-CM | POA: Diagnosis not present

## 2023-12-29 DIAGNOSIS — R278 Other lack of coordination: Secondary | ICD-10-CM

## 2023-12-29 NOTE — Therapy (Signed)
 OUTPATIENT PHYSICAL THERAPY MALE PELVIC TREATMENT   Patient Name: Carl Newton MRN: [id number] DOB:16-May-1981, 43 y.o., male Today's Date: 12/29/2023  END OF SESSION:  PT End of Session - 12/29/23 1102     Visit Number 3    Date for PT Re-Evaluation 06/04/24    Authorization Type [employer]    Authorization - Visit Number 3    Authorization - Number of Visits 30    PT Start Time 1100    PT Stop Time 1140    PT Time Calculation (min) 40 min    Activity Tolerance Patient tolerated treatment well    Behavior During Therapy WFL for tasks assessed/performed             Past Medical History:  Diagnosis Date   Atrial fibrillation (HCC)    Urinary retention    History reviewed. No pertinent surgical history. Patient Active Problem List   Diagnosis Date Noted   Paroxysmal atrial fibrillation (HCC) 06/29/2021    PCP: Donita Brooks, MD  REFERRING PROVIDER: Donita Brooks, MD   REFERRING DIAG: M62.89 (ICD-10-CM) - Pelvic floor dysfunction   THERAPY DIAG:  Cramp and spasm  Other lack of coordination  Rationale for Evaluation and Treatment: Rehabilitation  ONSET DATE: 2018  SUBJECTIVE:                                                                                                                                                                                           SUBJECTIVE STATEMENT: I have trouble engaging my lower abdominals.  No changes since eval.  Fluid intake: soda, water and sweet tea  PAIN:  Are you having pain? Yes: NPRS scale: 5/10 Pain location: back middle of the perineum Pain description: sensation like he needs to evacuate his bowels, cramping Aggravating factors: after ejaculation Relieving factors: massage  PAIN:  Are you having pain? Yes NPRS scale: 2/10 Pain location:  low back  Pain type: dull Pain description: intermittent   Aggravating factors: sitting too long, bending over, lifting Relieving factors: extend lumbar,  pillow in lumbar in sitting  PRECAUTIONS: None  RED FLAGS: None   WEIGHT BEARING RESTRICTIONS: No  FALLS:  Has patient fallen in last 6 months? 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   URINATION: Pain with urination: No Fully empty bladder: No, sometimes has the post  void dribble Stream: Weak, delayed starting, sits down to urinate Urgency: Yes: at times, when he wakes up does not have to urinate immediately Frequency: average 3-4 hours; 0 nighttime Leakage:  post void dribble   INTERCOURSE: Pain with intercourse:  no and pressure in the perineum after ejaculation Delayed ejaculation and less force of the ejaculation   OBJECTIVE:  Note: Objective measures were completed at Evaluation unless otherwise noted.  DIAGNOSTIC FINDINGS:  none    COGNITION: Overall cognitive status: Within functional limits for tasks assessed     SENSATION: Light touch: Appears intact Proprioception: Appears intact   POSTURE: No Significant postural limitations  PELVIC ALIGNMENT:  LUMBARAROM/PROM:  A/PROM A/PROM  eval  Extension Decreased by 25%  Right lateral flexion Decreased by 25%  Right rotation Decreased by 25%   (Blank rows = not tested)  LOWER EXTREMITY AROM/PROM:  A/PROM Right eval Left eval  Hip internal rotation 30 15   (Blank rows = not tested)    PALPATION: GENERAL bulging of the lower abdominals with contraction, decreased movement of the lower rib cage              External Perineal Exam tightness in the perineal body, along the ischiocavernosus, bulbocavernosus, and external anal sphincter              Internal Pelvic Floor tenderness located in  the puborectalis, and iliococcygeus. Not able to bring the therapist finger further into the rectum due to the external anal sphincter was too tight.  Patient confirms identification and approves PT to assess internal pelvic floor and treatment Yes; strength was not assessed at this time due to increased tone of the muscles.  PELVIC MMT:   MMT eval  Internal Anal Sphincter   External Anal Sphincter   Puborectalis   Diastasis Recti   (Blank rows = not tested)  TONE: increased  TODAY'S TREATMENT: 12/29/23 Manual: Soft tissue mobilization: Scar tissue mobilization: Myofascial release: Spinal mobilization: Internal pelvic floor techniques: Dry needling: Neuromuscular re-education: Core retraining: Working on engagement of the lower abdominal with diaphragmatic breathing in supine, sitting and standing with pressing into ball Supine hip flexion isometric to work on lower abdominal Supine alternate hip and shoulder flexion with core engagement Side plank with leg lift and arm at shoulder height working core and gluteal medius Bear plank with different movements with core Quadruped with lift alternate extremity with core Quadruped with bringing knee to elbow with core Exercises: Stretches/mobility: 1/2 kneel using belt to stretch hip laterally and anteriorly to work the capsule Quadruped with ball squeeze and hips IR rocking back and forth to stretch the posterior capsule Quadruped with band around ankles working hip internal rotation         12/11/23 Manual: Soft tissue mobilization: To assess for dry needling Manual work to the perineal body, bulbocavernosus, ischiocavernosus, obturator internist to elongate after dry needling Trigger Point Dry Needling  Initial Treatment: Pt instructed on Dry Needling rational, procedures, and possible side effects. Pt instructed to expect mild to moderate muscle soreness later in the day and/or into the next day.  Pt instructed in methods  to reduce muscle soreness. Pt instructed to continue prescribed HEP. Because Dry Needling was performed over or adjacent to a lung field, pt was educated on S/S of pneumothorax and to seek immediate medical attention should they occur.  Patient was educated on signs and symptoms of infection and other risk factors and advised to seek medical attention should they occur.  Patient verbalized  understanding of these instructions and education.   Patient Verbal Consent Given: Yes Education Handout Provided: Yes Muscles Treated: ischiocavernosus, bulbocavernosus, perineal body Electrical Stimulation Performed: No Treatment Response/Outcome: elongation of muscle and trigger point response Exercises: Stretches/mobility: Happy baby with diaphragmatic breathing Quadruped with hips internally rotated moving back and forth Pigeon pose holding 30 sec bil.  Z stretch moving up and down 5 times  Sit on ball and massage the pelvic floor                                                                                                                            DATE: 12/06/23  EVAL See below   PATIENT EDUCATION:  12/29/23 Education details: gave patient information on rectal wand, you tube video on perineal massage, and information on dry needling, Access Code: [email or web address] Person educated: Patient Education method: Explanation, Demonstration, Tactile cues, Verbal cues, and Handouts Education comprehension: verbalized understanding, returned demonstration, verbal cues required, tactile cues required, and needs further education  HOME EXERCISE PROGRAM: 12/29/23 Access Code: [email or web address] URL: [email or web address] Date: 12/29/2023 Prepared by: Eulis Foster  Exercises - Happy Baby with Pelvic Floor Lengthening  - 1 x daily - 7 x weekly - 1 sets - 1 reps - Quadruped Rocking Slow  - 1 x daily - 7 x weekly - 1 sets - 10 reps - Pigeon Pose  - 1 x daily - 7 x weekly - 1 sets - 1 reps - 30 sec hold -  Hooklying Isometric Hip Flexion  - 1 x daily - 3 x weekly - 3 sets - 10 reps - Modified Side Plank with Hip Abduction  - 1 x daily - 3 x weekly - 1 sets - 15 reps - Dead Bug  - 1 x daily - 3 x weekly - 2 sets - 10 reps - Bear Plank from [employer]  - 1 x daily - 3 x weekly - 1 sets - 10 reps - Bird Dog  - 1 x daily - 3 x weekly - 1 sets - 10 reps - Bird Dog with Knee Taps  - 1 x daily - 3 x weekly - 1 sets - 10 reps    ASSESSMENT:  CLINICAL IMPRESSION: Patient is a 43 y.o. male who was seen today for physical therapy  treatment for pelvic floor dysfunction.Treatment today was working on engagement of the lower abdominals to work on pushing the rest of the urine out. Diaphragmatic breathing to relax the pelvic floor to reduce the tension and improve mobility of the hip.   Patient will benefit from skilled therapy to reduce tension of the pelvic floor to improve function.   OBJECTIVE IMPAIRMENTS: decreased coordination, decreased ROM, increased fascial restrictions, increased muscle spasms, and pain.   ACTIVITY LIMITATIONS: sitting, standing, continence, toileting, and locomotion level  PARTICIPATION LIMITATIONS: interpersonal relationship, community activity, and occupation  PERSONAL FACTORS: Time since onset of injury/illness/exacerbation are also affecting patient's functional outcome.   REHAB POTENTIAL: Excellent  CLINICAL  DECISION MAKING: Evolving/moderate complexity  EVALUATION COMPLEXITY: Moderate   GOALS: Goals reviewed with patient? Yes  SHORT TERM GOALS: Target date: 01/03/24  Patient educated on perineal massage to reduce tension in the pelvic floor muscles.  Baseline: Goal status: Met 12/11/23  2.  Patient is able to perform diaphragmatic breathing to relax the pelvic floor during pain and urinating.  Baseline:  Goal status: Me 12/29/23  3.  Patient independent with initial HEP for back and hip flexibility.  Baseline:  Goal status: INITIAL  4.  Patient is able to  contract the lower abdominals equally with the upper.  Baseline:  Goal status: Met 12/29/23   LONG TERM GOALS: Target date: 06/04/24  Patient independent with advanced HEP for core and pelvic floor.  Baseline:  Goal status: INITIAL  2.  Patient is able to ejaculate without delay or pain due to improved lengthening of the pelvic floor muscles.  Baseline:  Goal status: INITIAL  3.  Patient is able to urinate without post void dribble due to the ability to engage the lower abdomen to fully empty the bladder.  Baseline:  Goal status: INITIAL  4.  Patient is able to maintain an erection for the time he is intimate with his wife due to increased endurance of the pelvic floor muscles.  Baseline:  Goal status: INITIAL   PLAN:  PT FREQUENCY: 1-2x/week  PT DURATION: 6 months  PLANNED INTERVENTIONS: 97110-Therapeutic exercises, 97530- Therapeutic activity, 97112- Neuromuscular re-education, 97535- Self Care, [id number]- Manual therapy, 97014- Electrical stimulation (unattended), 97035- Ultrasound, Patient/Family education, Taping, Dry Needling, Joint manipulation, Spinal mobilization, Cryotherapy, Moist heat, and Biofeedback  PLAN FOR NEXT SESSION: dry needling to the pelvic floor muscles,  manual work to the pelvic floor, review lower abdominal engagement   Eulis Foster, PT 12/29/23 12:01 PM

## 2024-01-08 ENCOUNTER — Encounter: Payer: Self-pay | Admitting: Physical Therapy

## 2024-01-08 ENCOUNTER — Ambulatory Visit: Payer: Self-pay | Attending: Family Medicine | Admitting: Physical Therapy

## 2024-01-08 DIAGNOSIS — R252 Cramp and spasm: Secondary | ICD-10-CM | POA: Diagnosis present

## 2024-01-08 DIAGNOSIS — R278 Other lack of coordination: Secondary | ICD-10-CM | POA: Diagnosis present

## 2024-01-08 NOTE — Therapy (Signed)
 OUTPATIENT PHYSICAL THERAPY MALE PELVIC TREATMENT   Patient Name: Carl Newton MRN: [id number] DOB:01/05/1981, 43 y.o., male Today's Date: 01/08/2024  END OF SESSION:  PT End of Session - 01/08/24 0930     Visit Number 4    Date for PT Re-Evaluation 06/04/24    Authorization Type [employer]    Authorization - Visit Number 4    Authorization - Number of Visits 30    PT Start Time 0930    PT Stop Time 1010    PT Time Calculation (min) 40 min    Activity Tolerance Patient tolerated treatment well    Behavior During Therapy WFL for tasks assessed/performed             Past Medical History:  Diagnosis Date   Atrial fibrillation (HCC)    Urinary retention    History reviewed. No pertinent surgical history. Patient Active Problem List   Diagnosis Date Noted   Paroxysmal atrial fibrillation (HCC) 06/29/2021    PCP: Donita Brooks, MD  REFERRING PROVIDER: Donita Brooks, MD   REFERRING DIAG: M62.89 (ICD-10-CM) - Pelvic floor dysfunction   THERAPY DIAG:  Cramp and spasm  Other lack of coordination  Rationale for Evaluation and Treatment: Rehabilitation  ONSET DATE: 2018  SUBJECTIVE:                                                                                                                                                                                           SUBJECTIVE STATEMENT: Some days I feel better than others. 50% better days. I have not the cramping after intercourse now in several weeks.  Fluid intake: soda, water and sweet tea  PAIN:  Are you having pain? Yes: NPRS scale: 5/10 Pain location: back middle of the perineum Pain description: sensation like he needs to evacuate his bowels, cramping Aggravating factors: after ejaculation Relieving factors: massage 01/08/24: No pain now  PAIN:  Are you having pain? Yes NPRS scale: 2/10 Pain location:  low back  Pain type: dull Pain description: intermittent   Aggravating factors:  sitting too long, bending over, lifting Relieving factors: extend lumbar, pillow in lumbar in sitting  PRECAUTIONS: None  RED FLAGS: None   WEIGHT BEARING RESTRICTIONS: No  FALLS:  Has patient fallen in last 6 months? 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  URINATION: Pain with urination: No Fully empty bladder: No, sometimes has the post void dribble Stream: Weak, delayed starting, sits down to urinate Urgency: Yes: at times, when he wakes up does not have to urinate immediately Frequency: average 3-4 hours; 0 nighttime Leakage:  post void dribble 01/08/24: post void dribble, trouble starting the urine stream for 10-20 sec, weak stream   INTERCOURSE: Pain with intercourse:  no and pressure in the perineum after ejaculation Delayed ejaculation and less force of the ejaculation   OBJECTIVE:  Note: Objective measures were completed at Evaluation unless otherwise noted.  DIAGNOSTIC FINDINGS:  none    COGNITION: Overall cognitive status: Within functional limits for tasks assessed     SENSATION: Light touch: Appears intact Proprioception: Appears intact   POSTURE: No Significant postural limitations  PELVIC ALIGNMENT:  LUMBARAROM/PROM:  A/PROM A/PROM  eval  Extension Decreased by 25%  Right lateral flexion Decreased by 25%  Right rotation Decreased by 25%   (Blank rows = not tested)  LOWER EXTREMITY AROM/PROM:  A/PROM Right eval Left eval  Hip internal rotation 30 15   (Blank rows = not tested)    PALPATION: GENERAL bulging of the lower abdominals with contraction, decreased movement of the lower rib cage              External Perineal Exam  tightness in the perineal body, along the ischiocavernosus, bulbocavernosus, and external anal sphincter              Internal Pelvic Floor tenderness located in the puborectalis, and iliococcygeus. Not able to bring the therapist finger further into the rectum due to the external anal sphincter was too tight.  Patient confirms identification and approves PT to assess internal pelvic floor and treatment Yes; strength was not assessed at this time due to increased tone of the muscles.  PELVIC MMT:   MMT eval  Internal Anal Sphincter   External Anal Sphincter   Puborectalis   Diastasis Recti   (Blank rows = not tested)  TONE: increased  TODAY'S TREATMENT: 01/08/24 Neuromuscular re-education: Pelvic floor contraction training: Using the RUSI with pelvic floor setting and ultrasound head just above the pubic bone Patient able to contract 7 mm for 5.3 sec without bearing down Contract 6 seconds and quick contractions working on lifting the pelvic floor  Down training: Diaphragmatic breathing to move the pelvic floor downward with the RUSI  Exercises: Stretches/mobility: Standing hamstring stretch holding 30 sec bil.  Pigeon pose holding 30 sec bil.  Strengthening: Side plank 10 x each side with pelvic floor contraction Quadruped lift opposite arm and leg with pelvic floor contraction Bear plank with leg movements and pelvic floor contraction Therapeutic activities: Functional strengthening activities: Self-care:     12/29/23 Neuromuscular re-education: Core retraining: Working on engagement of the lower abdominal with diaphragmatic breathing in supine, sitting and standing with pressing into ball Supine hip flexion isometric to work on lower abdominal Supine alternate hip and shoulder flexion with core engagement Side plank with leg lift and arm at shoulder height working core and gluteal medius Bear plank with different movements with core Quadruped with lift alternate  extremity with core Quadruped with bringing knee to elbow with core Exercises: Stretches/mobility: 1/2 kneel using belt to stretch hip laterally and anteriorly to work the capsule Quadruped with ball squeeze and hips IR rocking back and forth to stretch the posterior capsule Quadruped with band around ankles working hip internal rotation         12/11/23 Manual:  Soft tissue mobilization: To assess for dry needling Manual work to the perineal body, bulbocavernosus, ischiocavernosus, obturator internist to elongate after dry needling Trigger Point Dry Needling  Initial Treatment: Pt instructed on Dry Needling rational, procedures, and possible side effects. Pt instructed to expect mild to moderate muscle soreness later in the day and/or into the next day.  Pt instructed in methods to reduce muscle soreness. Pt instructed to continue prescribed HEP. Because Dry Needling was performed over or adjacent to a lung field, pt was educated on S/S of pneumothorax and to seek immediate medical attention should they occur.  Patient was educated on signs and symptoms of infection and other risk factors and advised to seek medical attention should they occur.  Patient verbalized understanding of these instructions and education.   Patient Verbal Consent Given: Yes Education Handout Provided: Yes Muscles Treated: ischiocavernosus, bulbocavernosus, perineal body Electrical Stimulation Performed: No Treatment Response/Outcome: elongation of muscle and trigger point response Exercises: Stretches/mobility: Happy baby with diaphragmatic breathing Quadruped with hips internally rotated moving back and forth Pigeon pose holding 30 sec bil.  Z stretch moving up and down 5 times  Sit on ball and massage the pelvic floor      PATIENT EDUCATION:  12/29/23 Education details: gave patient information on rectal wand, you tube video on perineal massage, and information on dry needling, Access Code:  [email or web address] Person educated: Patient Education method: Explanation, Demonstration, Tactile cues, Verbal cues, and Handouts Education comprehension: verbalized understanding, returned demonstration, verbal cues required, tactile cues required, and needs further education  HOME EXERCISE PROGRAM: 12/29/23 Access Code: [email or web address] URL: [email or web address] Date: 12/29/2023 Prepared by: Eulis Foster  Exercises - Happy Baby with Pelvic Floor Lengthening  - 1 x daily - 7 x weekly - 1 sets - 1 reps - Quadruped Rocking Slow  - 1 x daily - 7 x weekly - 1 sets - 10 reps - Pigeon Pose  - 1 x daily - 7 x weekly - 1 sets - 1 reps - 30 sec hold - Hooklying Isometric Hip Flexion  - 1 x daily - 3 x weekly - 3 sets - 10 reps - Modified Side Plank with Hip Abduction  - 1 x daily - 3 x weekly - 1 sets - 15 reps - Dead Bug  - 1 x daily - 3 x weekly - 2 sets - 10 reps - Bear Plank from [employer]  - 1 x daily - 3 x weekly - 1 sets - 10 reps - Bird Dog  - 1 x daily - 3 x weekly - 1 sets - 10 reps - Bird Dog with Knee Taps  - 1 x daily - 3 x weekly - 1 sets - 10 reps    ASSESSMENT:  CLINICAL IMPRESSION: Patient is a 43 y.o. male who was seen today for physical therapy  treatment for pelvic floor dysfunction. He is not having the cramp pain anymore. Patient is able to move the pelvic floor 7mm and hold 5 sec. He has difficulty with quick contractions. He is able to bulge the pelvic floor well.  He is now able to understand contracting the pelvic floor and do it with his exercises. Patient will benefit from skilled therapy to reduce tension of the pelvic floor to improve function.   OBJECTIVE IMPAIRMENTS: decreased coordination, decreased ROM, increased fascial restrictions, increased muscle spasms, and pain.   ACTIVITY LIMITATIONS: sitting, standing, continence, toileting, and locomotion level  PARTICIPATION LIMITATIONS: interpersonal relationship, community activity, and occupation  PERSONAL  FACTORS:  Time since onset of injury/illness/exacerbation are also affecting patient's functional outcome.   REHAB POTENTIAL: Excellent  CLINICAL DECISION MAKING: Evolving/moderate complexity  EVALUATION COMPLEXITY: Moderate   GOALS: Goals reviewed with patient? Yes  SHORT TERM GOALS: Target date: 01/03/24  Patient educated on perineal massage to reduce tension in the pelvic floor muscles.  Baseline: Goal status: Met 12/11/23  2.  Patient is able to perform diaphragmatic breathing to relax the pelvic floor during pain and urinating.  Baseline:  Goal status: Me 12/29/23  3.  Patient independent with initial HEP for back and hip flexibility.  Baseline:  Goal status: Met 01/08/24  4.  Patient is able to contract the lower abdominals equally with the upper.  Baseline:  Goal status: Met 12/29/23   LONG TERM GOALS: Target date: 06/04/24  Patient independent with advanced HEP for core and pelvic floor.  Baseline:  Goal status: INITIAL  2.  Patient is able to ejaculate without delay or pain due to improved lengthening of the pelvic floor muscles.  Baseline:  Goal status: INITIAL  3.  Patient is able to urinate without post void dribble due to the ability to engage the lower abdomen to fully empty the bladder.  Baseline:  Goal status: INITIAL  4.  Patient is able to maintain an erection for the time he is intimate with his wife due to increased endurance of the pelvic floor muscles.  Baseline:  Goal status: INITIAL   PLAN:  PT FREQUENCY: 1-2x/week  PT DURATION: 6 months  PLANNED INTERVENTIONS: 97110-Therapeutic exercises, 97530- Therapeutic activity, 97112- Neuromuscular re-education, 97535- Self Care, [id number]- Manual therapy, [id number]- Electrical stimulation (unattended), 97035- Ultrasound, Patient/Family education, Taping, Dry Needling, Joint manipulation, Spinal mobilization, Cryotherapy, Moist heat, and Biofeedback  PLAN FOR NEXT SESSION: progress pelvic floor strength; RUSI to  take measurements and see how his quick flicks are   Eulis Foster, PT 01/08/24 10:10 AM

## 2024-01-31 ENCOUNTER — Ambulatory Visit: Payer: Managed Care, Other (non HMO) | Admitting: Physical Therapy

## 2024-02-23 ENCOUNTER — Encounter: Payer: Self-pay | Admitting: Physical Therapy

## 2024-02-23 ENCOUNTER — Ambulatory Visit: Payer: Managed Care, Other (non HMO) | Attending: Family Medicine | Admitting: Physical Therapy

## 2024-02-23 DIAGNOSIS — R252 Cramp and spasm: Secondary | ICD-10-CM | POA: Diagnosis present

## 2024-02-23 DIAGNOSIS — R278 Other lack of coordination: Secondary | ICD-10-CM | POA: Diagnosis present

## 2024-02-23 NOTE — Therapy (Signed)
 OUTPATIENT PHYSICAL THERAPY MALE PELVIC TREATMENT   Patient Name: Carl Newton MRN: [id number] DOB:09-29-1981, 43 y.o., male Today's Date: 02/23/2024  END OF SESSION:  PT End of Session - 02/23/24 0937     Visit Number 5    Date for PT Re-Evaluation 06/04/24    Authorization Type [employer]    Authorization - Visit Number 5    Authorization - Number of Visits 30    PT Start Time 0935    PT Stop Time 1013    PT Time Calculation (min) 38 min    Activity Tolerance Patient tolerated treatment well    Behavior During Therapy WFL for tasks assessed/performed             Past Medical History:  Diagnosis Date   Atrial fibrillation (HCC)    Urinary retention    History reviewed. No pertinent surgical history. Patient Active Problem List   Diagnosis Date Noted   Paroxysmal atrial fibrillation (HCC) 06/29/2021    PCP: Austine Lefort, MD  REFERRING PROVIDER: Austine Lefort, MD   REFERRING DIAG: M62.89 (ICD-10-CM) - Pelvic floor dysfunction   THERAPY DIAG:  Cramp and spasm  Other lack of coordination  Rationale for Evaluation and Treatment: Rehabilitation  ONSET DATE: 2018  SUBJECTIVE:                                                                                                                                                                                           SUBJECTIVE STATEMENT: I did too many quick flicks and had a cramp. I have not been as consistent with exercise due to vacation. Job is high stress. Not getting the weird sensation with a bowel movement. I am having low back pain and feel tight.  Fluid intake: soda, water and sweet tea  PAIN:  Are you having pain? Yes: NPRS scale: 5/10 Pain location: back middle of the perineum Pain description: sensation like he needs to evacuate his bowels, cramping Aggravating factors: after ejaculation Relieving factors: massage 01/08/24: No pain now 02/23/24: pain level is 3/10  PAIN:  Are you having  pain? Yes NPRS scale: 2/10 02/23/24; pain level 5/10 Pain location:  low back  Pain type: dull Pain description: intermittent   Aggravating factors: sitting too long, bending over, lifting Relieving factors: extend lumbar, pillow in lumbar in sitting  PRECAUTIONS: None  RED FLAGS: None   WEIGHT BEARING RESTRICTIONS: No  FALLS:  Has patient fallen in last 6 months? 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   URINATION: Pain with urination: No Fully empty bladder: No, sometimes has the post void dribble Stream: Weak, delayed starting, sits down to urinate Urgency: Yes: at times, when he wakes up does not have to urinate immediately Frequency: average 3-4 hours; 0 nighttime Leakage:  post void dribble 01/08/24: post void dribble, trouble starting the urine stream for 10-20 sec, weak stream   INTERCOURSE: Pain with intercourse:  no and pressure in the perineum after ejaculation Delayed ejaculation and less force of the ejaculation   OBJECTIVE:  Note: Objective measures were completed at Evaluation unless otherwise noted.  DIAGNOSTIC FINDINGS:  none    COGNITION: Overall cognitive status: Within functional limits for tasks assessed     SENSATION: Light touch: Appears intact Proprioception: Appears intact   POSTURE: No Significant postural limitations  PELVIC ALIGNMENT:  LUMBARAROM/PROM:  A/PROM A/PROM  eval 02/23/24  Extension Decreased by 25% Decreased by 25%  Right lateral flexion Decreased by 25% Decreased by 25%  Right rotation Decreased by 25% Decreased by 25%   (Blank rows = not tested)  LOWER EXTREMITY AROM/PROM:  A/PROM Right eval Left eval  Right/left 02/23/24  Hip internal rotation 30 15 30/15   (Blank rows = not tested)    PALPATION: GENERAL bulging of the lower abdominals with contraction, decreased movement of the lower rib cage              External Perineal Exam tightness in the perineal body, along the ischiocavernosus, bulbocavernosus, and external anal sphincter              Internal Pelvic Floor tenderness located in the puborectalis, and iliococcygeus. Not able to bring the therapist finger further into the rectum due to the external anal sphincter was too tight.  Patient confirms identification and approves PT to assess internal pelvic floor and treatment Yes; strength was not assessed at this time due to increased tone of the muscles.  PELVIC MMT:   MMT eval  Internal Anal Sphincter   External Anal Sphincter   Puborectalis   Diastasis Recti   (Blank rows = not tested)  TONE: increased  TODAY'S TREATMENT: 02/23/24 Manual: Soft tissue mobilization: To assess for dry needling Manual work to the lumbar paraspinals and quadratus to elongate after dry needling Mobilization: PA and rotational mobilization to L1-L5 to improve mobility Using belt to mobilize each hip for lateral glide, inferior glide while moving the hips in different directions Dry needling: Trigger Point Dry Needling  Subsequent Treatment: Instructions provided previously at initial dry needling treatment.  Instructions reviewed, if requested by the patient, prior to subsequent dry needling treatment.   Patient Verbal Consent Given: Yes Education Handout Provided: Previously Provided Muscles Treated: lumbar paraspinals and quadratus Electrical Stimulation Performed: No Treatment Response/Outcome: trigger point response and elongation of muscles Exercises: Stretches/mobility: Knee to chest to increased hip flexion Childs pose with stretching the back Hip rock with hips internally rotated Prone press up to increase lumbar  extension    01/08/24 Neuromuscular re-education: Pelvic floor contraction training: Using the RUSI with pelvic floor setting and ultrasound head just above the pubic bone Patient able to contract 7 mm for 5.3 sec without bearing down Contract 6 seconds and quick contractions working on lifting the pelvic floor  Down training: Diaphragmatic breathing to move the pelvic floor downward with the RUSI  Exercises: Stretches/mobility: Standing hamstring stretch holding 30 sec bil.  Pigeon pose holding 30 sec bil.  Strengthening: Side plank 10 x each side with pelvic floor contraction Quadruped lift opposite arm and leg with pelvic floor contraction Bear plank with leg movements and pelvic floor contraction    12/29/23 Neuromuscular re-education: Core retraining: Working on engagement of the lower abdominal with diaphragmatic breathing in supine, sitting and standing with pressing into ball Supine hip flexion isometric to work on lower abdominal Supine alternate hip and shoulder flexion with core engagement Side plank with leg lift and arm at shoulder height working core and gluteal medius Bear plank with different movements with core Quadruped with lift alternate extremity with core Quadruped with bringing knee to elbow with core Exercises: Stretches/mobility: 1/2 kneel using belt to stretch hip laterally and anteriorly to work the capsule Quadruped with ball squeeze and hips IR rocking back and forth to stretch the posterior capsule Quadruped with band around ankles working hip internal rotation         12/11/23 Manual: Soft tissue mobilization: To assess for dry needling Manual work to the perineal body, bulbocavernosus, ischiocavernosus, obturator internist to elongate after dry needling Trigger Point Dry Needling  Initial Treatment: Pt instructed on Dry Needling rational, procedures, and possible side effects. Pt instructed to expect mild to moderate muscle soreness later in  the day and/or into the next day.  Pt instructed in methods to reduce muscle soreness. Pt instructed to continue prescribed HEP. Because Dry Needling was performed over or adjacent to a lung field, pt was educated on S/S of pneumothorax and to seek immediate medical attention should they occur.  Patient was educated on signs and symptoms of infection and other risk factors and advised to seek medical attention should they occur.  Patient verbalized understanding of these instructions and education.   Patient Verbal Consent Given: Yes Education Handout Provided: Yes Muscles Treated: ischiocavernosus, bulbocavernosus, perineal body Electrical Stimulation Performed: No Treatment Response/Outcome: elongation of muscle and trigger point response Exercises: Stretches/mobility: Happy baby with diaphragmatic breathing Quadruped with hips internally rotated moving back and forth Pigeon pose holding 30 sec bil.  Z stretch moving up and down 5 times  Sit on ball and massage the pelvic floor      PATIENT EDUCATION:  12/29/23 Education details: gave patient information on rectal wand, you tube video on perineal massage, and information on dry needling, Access Code: [email or web address] Person educated: Patient Education method: Explanation, Demonstration, Tactile cues, Verbal cues, and Handouts Education comprehension: verbalized understanding, returned demonstration, verbal cues required, tactile cues required, and needs further education  HOME EXERCISE PROGRAM: 12/29/23 Access Code: [email or web address] URL: [email or web address] Date: 12/29/2023 Prepared by: Marsha Skeen  Exercises - Happy Baby with Pelvic Floor Lengthening  - 1 x daily - 7 x weekly - 1 sets - 1 reps - Quadruped Rocking Slow  - 1 x daily - 7 x weekly - 1 sets - 10 reps - Pigeon Pose  - 1 x daily - 7 x weekly - 1 sets - 1 reps - 30 sec hold - Hooklying Isometric Hip Flexion  - 1 x daily - 3 x weekly - 3 sets - 10 reps - Modified  Side Plank with Hip Abduction  - 1 x daily - 3 x weekly - 1 sets - 15 reps - Dead Bug  - 1 x daily - 3 x weekly - 2 sets - 10 reps - Bear Plank from [employer]  - 1 x daily - 3 x  weekly - 1 sets - 10 reps - Bird Dog  - 1 x daily - 3 x weekly - 1 sets - 10 reps - Bird Dog with Knee Taps  - 1 x daily - 3 x weekly - 1 sets - 10 reps    ASSESSMENT:  CLINICAL IMPRESSION: Patient is a 43 y.o. male who was seen today for physical therapy  treatment for pelvic floor dysfunction. He is not having the cramp pain anymore. He is not having the pain with ejaculation. He has limited lumbar ROM and hip internal rotation motion that can affect the pelvic floor. Patient had no pain after the manual work.  Patient will benefit from skilled therapy to reduce tension of the pelvic floor to improve function.   OBJECTIVE IMPAIRMENTS: decreased coordination, decreased ROM, increased fascial restrictions, increased muscle spasms, and pain.   ACTIVITY LIMITATIONS: sitting, standing, continence, toileting, and locomotion level  PARTICIPATION LIMITATIONS: interpersonal relationship, community activity, and occupation  PERSONAL FACTORS: Time since onset of injury/illness/exacerbation are also affecting patient's functional outcome.   REHAB POTENTIAL: Excellent  CLINICAL DECISION MAKING: Evolving/moderate complexity  EVALUATION COMPLEXITY: Moderate   GOALS: Goals reviewed with patient? Yes  SHORT TERM GOALS: Target date: 01/03/24  Patient educated on perineal massage to reduce tension in the pelvic floor muscles.  Baseline: Goal status: Met 12/11/23  2.  Patient is able to perform diaphragmatic breathing to relax the pelvic floor during pain and urinating.  Baseline:  Goal status: Me 12/29/23  3.  Patient independent with initial HEP for back and hip flexibility.  Baseline:  Goal status: Met 01/08/24  4.  Patient is able to contract the lower abdominals equally with the upper.  Baseline:  Goal status: Met  12/29/23   LONG TERM GOALS: Target date: 06/04/24  Patient independent with advanced HEP for core and pelvic floor.  Baseline:  Goal status: INITIAL  2.  Patient is able to ejaculate without delay or pain due to improved lengthening of the pelvic floor muscles.  Baseline:  Goal status: Met 02/23/23  3.  Patient is able to urinate without post void dribble due to the ability to engage the lower abdomen to fully empty the bladder.  Baseline:  Goal status: INITIAL  4.  Patient is able to maintain an erection for the time he is intimate with his wife due to increased endurance of the pelvic floor muscles.  Baseline:  Goal status: INITIAL   PLAN:  PT FREQUENCY: 1-2x/week  PT DURATION: 6 months  PLANNED INTERVENTIONS: 97110-Therapeutic exercises, 97530- Therapeutic activity, [id number]- Neuromuscular re-education, 97535- Self Care, 47829- Manual therapy, 97014- Electrical stimulation (unattended), 97035- Ultrasound, Patient/Family education, Taping, Dry Needling, Joint manipulation, Spinal mobilization, Cryotherapy, Moist heat, and Biofeedback  PLAN FOR NEXT SESSION: update goals, see if need dry needling to lumbar   Marsha Skeen, PT 02/23/24 11:00 AM

## 2024-03-04 ENCOUNTER — Encounter: Payer: Managed Care, Other (non HMO) | Admitting: Physical Therapy

## 2024-03-22 ENCOUNTER — Encounter: Payer: Managed Care, Other (non HMO) | Admitting: Physical Therapy

## 2024-05-03 ENCOUNTER — Telehealth: Admitting: Physician Assistant

## 2024-05-03 DIAGNOSIS — M545 Low back pain, unspecified: Secondary | ICD-10-CM | POA: Diagnosis not present

## 2024-05-03 MED ORDER — METHYLPREDNISOLONE 4 MG PO TBPK: ORAL_TABLET | ORAL | 0 refills | Status: DC

## 2024-05-03 MED ORDER — CYCLOBENZAPRINE HCL 10 MG PO TABS
5.0000 mg | ORAL_TABLET | Freq: Three times a day (TID) | ORAL | 0 refills | Status: DC | PRN
  Filled 2024-05-03: qty 30, 10d supply, fill #0

## 2024-05-03 NOTE — Patient Instructions (Signed)
 Carl Newton, thank you for joining Carl CHRISTELLA Dickinson, PA-C for today's virtual visit.  While this provider is not your primary care provider (PCP), if your PCP is located in our provider database this encounter information will be shared with them immediately following your visit.   A Carl Newton MyChart account gives you access to today's visit and all your visits, tests, and labs performed at [facility]  click here if you don't have a [facility] MyChart account or go to [email or web address]  Consent: (Patient) Carl Newton provided verbal consent for this virtual visit at the beginning of the encounter.  Current Medications:  Current Outpatient Medications:    cyclobenzaprine  (FLEXERIL ) 10 MG tablet, Take 0.5-1 tablets (5-10 mg total) by mouth 3 (three) times daily as needed., Disp: 30 tablet, Rfl: 0   loratadine (CLARITIN) 10 MG tablet, Take 1 tablet by mouth every day as needed for allergies., Disp: , Rfl:    methylPREDNISolone  (MEDROL  DOSEPAK) 4 MG TBPK tablet, 6 day taper; take as directed on package instructions, Disp: 21 tablet, Rfl: 0   Medications ordered in this encounter:  Meds ordered this encounter  Medications   methylPREDNISolone  (MEDROL  DOSEPAK) 4 MG TBPK tablet    Sig: 6 day taper; take as directed on package instructions    Dispense:  21 tablet    Refill:  0    Supervising Provider:   BLAISE ALEENE KIDD [[id number]]   cyclobenzaprine  (FLEXERIL ) 10 MG tablet    Sig: Take 0.5-1 tablets (5-10 mg total) by mouth 3 (three) times daily as needed.    Dispense:  30 tablet    Refill:  0    Supervising Provider:   LAMPTEY, PHILIP O [[id number]]     *If you need refills on other medications prior to your next appointment, please contact your pharmacy*  Follow-Up: Call back or seek an in-person evaluation if the symptoms worsen or if the condition fails to improve as anticipated.  [facility] [phone]  Other  Instructions Back Exercises The following exercises strengthen the muscles that help to support the trunk (torso) and back. They also help to keep the lower back flexible. Doing these exercises can help to prevent or lessen existing low back pain. If you have back pain or discomfort, try doing these exercises 2-3 times each day or as told by your health care provider. As your pain improves, do them once each day, but increase the number of times that you repeat the steps for each exercise (do more repetitions). To prevent the recurrence of back pain, continue to do these exercises once each day or as told by your health care provider. Do exercises exactly as told by your health care provider and adjust them as directed. It is normal to feel mild stretching, pulling, tightness, or discomfort as you do these exercises, but you should stop right away if you feel sudden pain or your pain gets worse. Exercises Single knee to chest Repeat these steps 3-5 times for each leg: Lie on your back on a firm bed or the floor with your legs extended. Bring one knee to your chest. Your other leg should stay extended and in contact with the floor. Hold your knee in place by grabbing your knee or thigh with both hands and hold. Pull on your knee until you feel a gentle stretch in your lower back or buttocks. Hold the stretch for 10-30 seconds. Slowly release and straighten your leg.  Pelvic tilt Repeat these  steps 5-10 times: Lie on your back on a firm bed or the floor with your legs extended. Bend your knees so they are pointing toward the ceiling and your feet are flat on the floor. Tighten your lower abdominal muscles to press your lower back against the floor. This motion will tilt your pelvis so your tailbone points up toward the ceiling instead of pointing to your feet or the floor. With gentle tension and even breathing, hold this position for 5-10 seconds.  Cat-cow Repeat these steps until your lower  back becomes more flexible: Get into a hands-and-knees position on a firm bed or the floor. Keep your hands under your shoulders, and keep your knees under your hips. You may place padding under your knees for comfort. Let your head hang down toward your chest. Contract your abdominal muscles and point your tailbone toward the floor so your lower back becomes rounded like the back of a cat. Hold this position for 5 seconds. Slowly lift your head, let your abdominal muscles relax, and point your tailbone up toward the ceiling so your back forms a sagging arch like the back of a cow. Hold this position for 5 seconds.  Press-ups Repeat these steps 5-10 times: Lie on your abdomen (face-down) on a firm bed or the floor. Place your palms near your head, about shoulder-width apart. Keeping your back as relaxed as possible and keeping your hips on the floor, slowly straighten your arms to raise the top half of your body and lift your shoulders. Do not use your back muscles to raise your upper torso. You may adjust the placement of your hands to make yourself more comfortable. Hold this position for 5 seconds while you keep your back relaxed. Slowly return to lying flat on the floor.  Bridges Repeat these steps 10 times: Lie on your back on a firm bed or the floor. Bend your knees so they are pointing toward the ceiling and your feet are flat on the floor. Your arms should be flat at your sides, next to your body. Tighten your buttocks muscles and lift your buttocks off the floor until your waist is at almost the same height as your knees. You should feel the muscles working in your buttocks and the back of your thighs. If you do not feel these muscles, slide your feet 1-2 inches (2.5-5 cm) farther away from your buttocks. Hold this position for 3-5 seconds. Slowly lower your hips to the starting position, and allow your buttocks muscles to relax completely. If this exercise is too easy, try doing it  with your arms crossed over your chest. Abdominal crunches Repeat these steps 5-10 times: Lie on your back on a firm bed or the floor with your legs extended. Bend your knees so they are pointing toward the ceiling and your feet are flat on the floor. Cross your arms over your chest. Tip your chin slightly toward your chest without bending your neck. Tighten your abdominal muscles and slowly raise your torso high enough to lift your shoulder blades a tiny bit off the floor. Avoid raising your torso higher than that because it can put too much stress on your lower back and does not help to strengthen your abdominal muscles. Slowly return to your starting position.  Back lifts Repeat these steps 5-10 times: Lie on your abdomen (face-down) with your arms at your sides, and rest your forehead on the floor. Tighten the muscles in your legs and your buttocks. Slowly lift your  chest off the floor while you keep your hips pressed to the floor. Keep the back of your head in line with the curve in your back. Your eyes should be looking at the floor. Hold this position for 3-5 seconds. Slowly return to your starting position.  Contact a health care provider if: Your back pain or discomfort gets much worse when you do an exercise. Your worsening back pain or discomfort does not lessen within 2 hours after you exercise. If you have any of these problems, stop doing these exercises right away. Do not do them again unless your health care provider says that you can. Get help right away if: You develop sudden, severe back pain. If this happens, stop doing the exercises right away. Do not do them again unless your health care provider says that you can. This information is not intended to replace advice given to you by your health care provider. Make sure you discuss any questions you have with your health care provider. Document Revised: 11/20/2022 Document Reviewed: 12/30/2020 Elsevier Patient Education   2024 [employer]   If you have been instructed to have an in-person evaluation today at a local Urgent Care facility, please use the link below. It will take you to a list of all of our available [facility], including address, phone number and hours of operation. Please do not delay care.  [facility]  If you or a family member do not have a primary care provider, use the link below to schedule a visit and establish care. When you choose a [facility] primary care physician or advanced practice provider, you gain a long-term partner in health. Find a Primary Care Provider  Learn more about [facility]'s in-office and virtual care options: [facility] - Get Care Now

## 2024-05-03 NOTE — Progress Notes (Signed)
 Virtual Visit Consent   Carl Newton, you are scheduled for a virtual visit with a [facility] provider today. Just as with appointments in the office, your consent must be obtained to participate. Your consent will be active for this visit and any virtual visit you may have with one of our providers in the next 365 days. If you have a MyChart account, a copy of this consent can be sent to you electronically.  As this is a virtual visit, video technology does not allow for your provider to perform a traditional examination. This may limit your provider's ability to fully assess your condition. If your provider identifies any concerns that need to be evaluated in person or the need to arrange testing (such as labs, EKG, etc.), we will make arrangements to do so. Although advances in technology are sophisticated, we cannot ensure that it will always work on either your end or our end. If the connection with a video visit is poor, the visit may have to be switched to a telephone visit. With either a video or telephone visit, we are not always able to ensure that we have a secure connection.  By engaging in this virtual visit, you consent to the provision of healthcare and authorize for your insurance to be billed (if applicable) for the services provided during this visit. Depending on your insurance coverage, you may receive a charge related to this service.  I need to obtain your verbal consent now. Are you willing to proceed with your visit today? Carl Newton has provided verbal consent on 05/03/2024 for a virtual visit (video or telephone). Carl CHRISTELLA Dickinson, PA-C  Date: 05/03/2024 2:06 PM   Virtual Visit via Video Note   I, Carl Newton, connected with  Carl Newton  ([id number], 1981-05-10) on 05/03/24 at  2:00 PM EDT by a video-enabled telemedicine application and verified that I am speaking with the correct person using two identifiers.  Location: Patient:  Virtual Visit Location Patient: Home Provider: Virtual Visit Location Provider: Home Office   I discussed the limitations of evaluation and management by telemedicine and the availability of in person appointments. The patient expressed understanding and agreed to proceed.    History of Present Illness: Carl Newton is a 43 y.o. who identifies as a male who was assigned male at birth, and is being seen today for back pain.  HPI: Back Pain This is a recurrent problem. The current episode started yesterday. The problem occurs constantly. The problem has been gradually worsening since onset. The pain is present in the lumbar spine. The quality of the pain is described as shooting, cramping and aching. Radiates to: into gluteal region bilaterally. The pain is moderate. Stiffness is present All day. Associated symptoms include numbness (around tail bone) and weakness. Pertinent negatives include no bladder incontinence, bowel incontinence, fever, headaches, leg pain, perianal numbness or tingling. He has tried NSAIDs (Ibuprofen  600mg  q 6 hours) for the symptoms. The treatment provided no relief.   PMH: Herniated disc L5-S1  Problems:  Patient Active Problem List   Diagnosis Date Noted   Paroxysmal atrial fibrillation (HCC) 06/29/2021    Allergies: No Known Allergies Medications:  Current Outpatient Medications:    cyclobenzaprine  (FLEXERIL ) 10 MG tablet, Take 0.5-1 tablets (5-10 mg total) by mouth 3 (three) times daily as needed., Disp: 30 tablet, Rfl: 0   loratadine (CLARITIN) 10 MG tablet, Take 1 tablet by mouth every day as needed for allergies., Disp: , Rfl:  methylPREDNISolone  (MEDROL  DOSEPAK) 4 MG TBPK tablet, 6 day taper; take as directed on package instructions, Disp: 21 tablet, Rfl: 0  Observations/Objective: Patient is well-developed, well-nourished in no acute distress.  Resting comfortably at home.  Head is normocephalic, atraumatic.  No labored breathing.  Speech is  clear and coherent with logical content.  Patient is alert and oriented at baseline.    Assessment and Plan: 1. Acute midline low back pain without sciatica (Primary) - methylPREDNISolone  (MEDROL  DOSEPAK) 4 MG TBPK tablet; 6 day taper; take as directed on package instructions  Dispense: 21 tablet; Refill: 0 - cyclobenzaprine  (FLEXERIL ) 10 MG tablet; Take 0.5-1 tablets (5-10 mg total) by mouth 3 (three) times daily as needed.  Dispense: 30 tablet; Refill: 0  - Suspect acute flare of a herniated disc - Failed NSAIDs - Add Medrol  dose pack and flexeril  - Avoid NSAIDs (Ibuprofen /Advil /Motrin  or Naproxen/Aleve) while on steroid - Tylenol  if okay for breakthrough pain - Heat to area - Epsom salt soak if able to get in and out of bath tub safely - Back exercises and stretches provided via AVS - Seek in person evaluation if worsening or fails to improve with treatment   Follow Up Instructions: I discussed the assessment and treatment plan with the patient. The patient was provided an opportunity to ask questions and all were answered. The patient agreed with the plan and demonstrated an understanding of the instructions.  A copy of instructions were sent to the patient via MyChart unless otherwise noted below.    The patient was advised to call back or seek an in-person evaluation if the symptoms worsen or if the condition fails to improve as anticipated.    Carl CHRISTELLA Dickinson, PA-C

## 2024-05-04 ENCOUNTER — Other Ambulatory Visit (HOSPITAL_BASED_OUTPATIENT_CLINIC_OR_DEPARTMENT_OTHER): Payer: Self-pay

## 2024-05-06 ENCOUNTER — Other Ambulatory Visit (HOSPITAL_BASED_OUTPATIENT_CLINIC_OR_DEPARTMENT_OTHER): Payer: Self-pay

## 2024-05-06 MED ORDER — PREDNISONE 5 MG (21) PO TBPK
ORAL_TABLET | ORAL | 1 refills | Status: AC
  Filled 2024-05-06: qty 21, 6d supply, fill #0

## 2024-05-13 ENCOUNTER — Ambulatory Visit (INDEPENDENT_AMBULATORY_CARE_PROVIDER_SITE_OTHER): Admitting: Family Medicine

## 2024-05-13 VITALS — BP 126/72 | HR 72 | Temp 97.7°F | Ht 67.0 in | Wt 164.0 lb

## 2024-05-13 DIAGNOSIS — R1084 Generalized abdominal pain: Secondary | ICD-10-CM

## 2024-05-13 NOTE — Progress Notes (Signed)
 Subjective:    Patient ID: Carl Newton, male    DOB: 1981/01/22, 43 y.o.   MRN: [id number]  Abdominal Pain  Patient developed right lower quadrant abdominal pain starting on Thursday.  By Friday intensified.  He states that he had some evidence of peritoneal inflammation such as rebound.  However he denied any fever.  He does report some mild constipation.  He denies any nausea or vomiting.  Symptoms gradually improved over Saturday and Sunday.  Today he has very minimal right lower quadrant abdominal pain.  He has no guarding.  There is no rebound.  He has normal bowel sounds.  He continues to have incomplete bowel evacuation.  Past Medical History:  Diagnosis Date   Atrial fibrillation [facility])    Urinary retention     No past surgical history on file.  No Known Allergies Social History   Socioeconomic History   Marital status: Married    Spouse name: Not on file   Number of children: Not on file   Years of education: Not on file   Highest education level: Doctorate  Occupational History   Not on file  Tobacco Use   Smoking status: Never   Smokeless tobacco: Never  Substance and Sexual Activity   Alcohol use: No   Drug use: No   Sexual activity: Not on file  Other Topics Concern   Not on file  Social History Narrative   Not on file   Social Drivers of Health   Financial Resource Strain: Low Risk  (05/13/2024)   Overall Financial Resource Strain (CARDIA)    Difficulty of Paying Living Expenses: Not hard at all  Food Insecurity: No Food Insecurity (05/13/2024)   Hunger Vital Sign    Worried About Running Out of Food in the Last Year: Never true    Ran Out of Food in the Last Year: Never true  Transportation Needs: No Transportation Needs (05/13/2024)   PRAPARE - Administrator, Civil Service (Medical): No    Lack of Transportation (Non-Medical): No  Physical Activity: Insufficiently Active (05/13/2024)   Exercise Vital Sign    Days of Exercise  per Week: 2 days    Minutes of Exercise per Session: 20 min  Stress: No Stress Concern Present (05/13/2024)   [employer] of Occupational Health - Occupational Stress Questionnaire    Feeling of Stress: Only a little  Social Connections: Socially Integrated (05/13/2024)   Social Connection and Isolation Panel    Frequency of Communication with Friends and Family: Three times a week    Frequency of Social Gatherings with Friends and Family: Once a week    Attends Religious Services: More than 4 times per year    Active Member of [employer] or Organizations: Yes    Attends Engineer, structural: More than 4 times per year    Marital Status: Married  Catering manager Violence: Not on file   Patient is married with 3 children.  He works as a Teacher, early years/pre. Family History  Problem Relation Age of Onset   Cancer Father        prostate   Alzheimer's disease Father    Heart disease Maternal Uncle    Heart disease Maternal Grandmother    Heart disease Maternal Grandfather    Alzheimer's disease Paternal Aunt    Alzheimer's disease Paternal Uncle      Review of Systems  Gastrointestinal:  Positive for abdominal pain.  All other systems reviewed and are negative.  Objective:   Physical Exam Vitals reviewed.  Constitutional:      General: He is not in acute distress.    Appearance: He is well-developed. He is not diaphoretic.  HENT:     Head: Normocephalic and atraumatic.     Right Ear: Tympanic membrane, ear canal and external ear normal.     Left Ear: Tympanic membrane, ear canal and external ear normal.     Nose: Nose normal. No congestion or rhinorrhea.  Eyes:     General:        Right eye: No discharge.        Left eye: No discharge.     Conjunctiva/sclera: Conjunctivae normal.  Neck:     Thyroid: No thyromegaly.     Vascular: No JVD.     Trachea: No tracheal deviation.  Cardiovascular:     Rate and Rhythm: Normal rate and regular rhythm.     Heart sounds:  Normal heart sounds. No murmur heard.    No friction rub. No gallop.  Pulmonary:     Effort: Pulmonary effort is normal. No respiratory distress.     Breath sounds: Normal breath sounds. No stridor. No wheezing or rales.  Chest:     Chest wall: No tenderness.  Abdominal:     General: Abdomen is flat. Bowel sounds are normal. There is no distension. There are no signs of injury.     Palpations: Abdomen is soft. There is no mass.     Tenderness: There is no abdominal tenderness. There is no right CVA tenderness, left CVA tenderness, guarding or rebound. Negative signs include Murphy's sign and McBurney's sign.     Hernia: No hernia is present. There is no hernia in the umbilical area or ventral area.  Musculoskeletal:        General: No tenderness or deformity. Normal range of motion.     Cervical back: Normal range of motion and neck supple.  Lymphadenopathy:     Cervical: No cervical adenopathy.  Skin:    General: Skin is warm.     Coloration: Skin is not pale.     Findings: No erythema or rash.  Neurological:     General: No focal deficit present.     Mental Status: He is alert and oriented to person, place, and time. Mental status is at baseline.     Cranial Nerves: No cranial nerve deficit.     Sensory: No sensory deficit.     Motor: No weakness or abnormal muscle tone.     Coordination: Coordination normal.     Gait: Gait normal.     Deep Tendon Reflexes: Reflexes are normal and symmetric.  Psychiatric:        Thought Content: Thought content normal.        Judgment: Judgment normal.           Assessment & Plan:  Generalized abdominal pain - Plan: CBC with Differential/Platelet, Comprehensive metabolic panel with GFR, Sedimentation rate Physical exam is normal today.  I suspect the patient was dealing with abdominal pain due to possible constipation.  Recommended checking a white blood cell count along with a sed rate.  If elevated, I will proceed with a CT scan of the  abdomen and pelvis to evaluate further.  If normal, I recommended trying Linzess 145 mcg p.o. daily until the patient experiences a normal bowel movement

## 2024-05-14 ENCOUNTER — Ambulatory Visit: Payer: Self-pay | Admitting: Family Medicine

## 2024-05-14 LAB — COMPREHENSIVE METABOLIC PANEL WITH GFR
AG Ratio: 1.6 (calc) (ref 1.0–2.5)
ALT: 12 U/L (ref 9–46)
AST: 18 U/L (ref 10–40)
Albumin: 4.4 g/dL (ref 3.6–5.1)
Alkaline phosphatase (APISO): 59 U/L (ref 36–130)
BUN: 13 mg/dL (ref 7–25)
CO2: 29 mmol/L (ref 20–32)
Calcium: 9.2 mg/dL (ref 8.6–10.3)
Chloride: 102 mmol/L (ref 98–110)
Creat: 1.04 mg/dL (ref 0.60–1.29)
Globulin: 2.8 g/dL (ref 1.9–3.7)
Glucose, Bld: 83 mg/dL (ref 65–99)
Potassium: 4 mmol/L (ref 3.5–5.3)
Sodium: 140 mmol/L (ref 135–146)
Total Bilirubin: 0.4 mg/dL (ref 0.2–1.2)
Total Protein: 7.2 g/dL (ref 6.1–8.1)
eGFR: 93 mL/min/1.73m2 (ref >=60)

## 2024-05-14 LAB — CBC WITH DIFFERENTIAL/PLATELET
Absolute Lymphocytes: 2406 {cells}/uL (ref 850–3900)
Absolute Monocytes: 546 {cells}/uL (ref 200–950)
Basophils Absolute: 30 {cells}/uL (ref 0–200)
Basophils Relative: 0.5 %
Eosinophils Absolute: 150 {cells}/uL (ref 15–500)
Eosinophils Relative: 2.5 %
HCT: 43.2 % (ref 38.5–50.0)
Hemoglobin: 14.5 g/dL (ref 13.2–17.1)
MCH: 30.7 pg (ref 27.0–33.0)
MCHC: 33.6 g/dL (ref 32.0–36.0)
MCV: 91.3 fL (ref 80.0–100.0)
MPV: 9.4 fL (ref 7.5–12.5)
Monocytes Relative: 9.1 %
Neutro Abs: 2868 {cells}/uL (ref 1500–7800)
Neutrophils Relative %: 47.8 %
Platelets: 265 Thousand/uL (ref 140–400)
RBC: 4.73 Million/uL (ref 4.20–5.80)
RDW: 12.5 % (ref 11.0–15.0)
Total Lymphocyte: 40.1 %
WBC: 6 Thousand/uL (ref 3.8–10.8)

## 2024-05-14 LAB — SEDIMENTATION RATE: Sed Rate: 9 mm/h (ref 0–15)

## 2024-07-19 ENCOUNTER — Encounter: Payer: Self-pay | Admitting: Family Medicine

## 2024-07-19 ENCOUNTER — Other Ambulatory Visit: Payer: Self-pay | Admitting: Family Medicine

## 2024-07-19 ENCOUNTER — Other Ambulatory Visit (HOSPITAL_BASED_OUTPATIENT_CLINIC_OR_DEPARTMENT_OTHER): Payer: Self-pay

## 2024-07-19 MED ORDER — SILDENAFIL CITRATE 100 MG PO TABS
50.0000 mg | ORAL_TABLET | Freq: Every day | ORAL | 11 refills | Status: AC | PRN
  Filled 2024-07-19: qty 5, 5d supply, fill #0
  Filled 2024-09-03: qty 30, 30d supply, fill #1

## 2024-09-03 ENCOUNTER — Encounter (HOSPITAL_BASED_OUTPATIENT_CLINIC_OR_DEPARTMENT_OTHER): Payer: Self-pay

## 2024-09-03 ENCOUNTER — Other Ambulatory Visit (HOSPITAL_BASED_OUTPATIENT_CLINIC_OR_DEPARTMENT_OTHER): Payer: Self-pay

## 2024-09-05 ENCOUNTER — Other Ambulatory Visit (HOSPITAL_BASED_OUTPATIENT_CLINIC_OR_DEPARTMENT_OTHER): Payer: Self-pay

## 2024-09-05 MED ORDER — PREDNISONE 5 MG (21) PO TBPK
ORAL_TABLET | ORAL | 0 refills | Status: AC
  Filled 2024-09-05: qty 21, 6d supply, fill #0

## 2024-09-05 MED ORDER — CELECOXIB 200 MG PO CAPS
200.0000 mg | ORAL_CAPSULE | Freq: Every day | ORAL | 1 refills | Status: AC | PRN
  Filled 2024-09-05: qty 30, 30d supply, fill #0
  Filled 2024-10-16: qty 30, 30d supply, fill #1

## 2024-09-06 ENCOUNTER — Other Ambulatory Visit (HOSPITAL_BASED_OUTPATIENT_CLINIC_OR_DEPARTMENT_OTHER): Payer: Self-pay

## 2024-09-12 ENCOUNTER — Other Ambulatory Visit (HOSPITAL_BASED_OUTPATIENT_CLINIC_OR_DEPARTMENT_OTHER): Payer: Self-pay

## 2024-09-12 MED ORDER — FLUZONE 0.5 ML IM SUSY
0.5000 mL | PREFILLED_SYRINGE | Freq: Once | INTRAMUSCULAR | 0 refills | Status: AC
  Filled 2024-09-12: qty 0.5, 1d supply, fill #0

## 2024-09-22 DIAGNOSIS — M545 Low back pain, unspecified: Secondary | ICD-10-CM | POA: Insufficient documentation

## 2024-10-16 ENCOUNTER — Encounter (HOSPITAL_BASED_OUTPATIENT_CLINIC_OR_DEPARTMENT_OTHER): Payer: Self-pay

## 2024-10-16 ENCOUNTER — Other Ambulatory Visit (HOSPITAL_BASED_OUTPATIENT_CLINIC_OR_DEPARTMENT_OTHER): Payer: Self-pay

## 2024-10-17 ENCOUNTER — Other Ambulatory Visit (HOSPITAL_BASED_OUTPATIENT_CLINIC_OR_DEPARTMENT_OTHER): Payer: Self-pay

## 2024-10-17 MED ORDER — CYCLOBENZAPRINE HCL 10 MG PO TABS
5.0000 mg | ORAL_TABLET | Freq: Three times a day (TID) | ORAL | 0 refills | Status: AC
  Filled 2024-10-17: qty 30, 10d supply, fill #0

## 2024-10-22 ENCOUNTER — Ambulatory Visit: Admitting: Physical Therapy

## 2024-10-22 DIAGNOSIS — M62830 Muscle spasm of back: Secondary | ICD-10-CM | POA: Insufficient documentation

## 2024-10-22 DIAGNOSIS — M5459 Other low back pain: Secondary | ICD-10-CM | POA: Insufficient documentation

## 2024-10-22 NOTE — Therapy (Signed)
 " OUTPATIENT PHYSICAL THERAPY THORACOLUMBAR EVALUATION   Patient Name: Carl Newton MRN: [id number] DOB:08/19/1981, 43 y.o., male Today's Date: 10/22/2024  END OF SESSION:  PT End of Session - 10/22/24 1101     Visit Number 1    Number of Visits 9    Date for Recertification  01/14/25    Authorization Type [employer]    PT Start Time 1100    PT Stop Time 1149    PT Time Calculation (min) 49 min    Activity Tolerance Patient tolerated treatment well    Behavior During Therapy WFL for tasks assessed/performed          Past Medical History:  Diagnosis Date   Atrial fibrillation (HCC)    Urinary retention    No past surgical history on file. Patient Active Problem List   Diagnosis Date Noted   Paroxysmal atrial fibrillation (HCC) 06/29/2021    PCP: Duanne Butler DASEN, MD   REFERRING PROVIDER:  Duwayne Purchase, MD   REFERRING DIAG: Low back pain, unspecified [M54.50]   Rationale for Evaluation and Treatment: Rehabilitation  THERAPY DIAG:  Other low back pain  Muscle spasm of back  ONSET DATE: Labor day 2025  SUBJECTIVE:                                                                                                                                                                                           SUBJECTIVE STATEMENT: Pt arrives noting pain in the mid to upper back, that started back around labor day of 2025 with no specific MOI, but reports potential cause could be that he had a drive for 3 hours. He denies any LE referral, no UE referral. He reports since onset the symptoms fluctuate depending on the position and laying at night on the Left.   PERTINENT HISTORY:  See Pmhx  PAIN:  Are you having pain? Yes: NPRS scale: 3/10, at worst 5/10 Pain location: mid upper back  Pain description: Pressure, dull aching  Aggravating factors: Left sidelying,  sitting in slouched position, excessive tw Relieving factors: self- popping the back. Childs pose stretch.    PRECAUTIONS: None  RED FLAGS: None   WEIGHT BEARING RESTRICTIONS: No  FALLS:  Has patient fallen in last 6 months? No  LIVING ENVIRONMENT: Lives with: lives with their family   OCCUPATION: IT  PLOF: Independent  PATIENT GOALS: to decrease pain   OBJECTIVE:  Note: Objective measures were completed at Evaluation unless otherwise noted.  DIAGNOSTIC FINDINGS:  MRI   PATIENT SURVEYS:  Modified Oswestry:  MODIFIED OSWESTRY DISABILITY SCALE   Date: 10/22/2024  Pain intensity 4 =  Pain medication provides me with little relief from pain.  2. Personal care (washing, dressing, etc.) 0 =  I can take care of myself normally without causing increased pain.  3. Lifting 3 = Pain prevents me from lifting heavy weights, but I can manage light to medium weights if they are conveniently positioned  4. Walking 0 = Pain does not prevent me from walking any distance  5. Sitting 2 =  Pain prevents me from sitting more than 1 hour.  6. Standing 0 =  I can stand as long as I want without increased pain.  7. Sleeping 0 = Pain does not prevent me from sleeping well.  8. Social Life 0 = My social life is normal and does not increase my pain.  9. Traveling 1 =  I can travel anywhere, but it increases my pain.  10. Employment/ Homemaking 0 = My normal homemaking/job activities do not cause pain.  Total 10/50   Interpretation of scores: Score Category Description  0-20% Minimal Disability The patient can cope with most living activities. Usually no treatment is indicated apart from advice on lifting, sitting and exercise  21-40% Moderate Disability The patient experiences more pain and difficulty with sitting, lifting and standing. Travel and social life are more difficult and they may be disabled from work. Personal care, sexual activity and sleeping are not grossly affected, and the patient can usually be managed by conservative means  41-60% Severe Disability Pain remains the main problem in  this group, but activities of daily living are affected. These patients require a detailed investigation  61-80% Crippled Back pain impinges on all aspects of the patients life. Positive intervention is required  81-100% Bed-bound These patients are either bed-bound or exaggerating their symptoms  Bluford FORBES Zoe DELENA Karon DELENA, et al. Surgery versus conservative management of stable thoracolumbar fracture: the PRESTO feasibility RCT. [place] ([place]): [employer]; 2021 Nov. [facility] Technology Assessment, No. 25.62.) Appendix 3, Oswestry Disability Index category descriptors. Available from: Findjewelers.cz  Minimally Clinically Important Difference (MCID) = 12.8%  COGNITION: Overall cognitive status: Within functional limits for tasks assessed       POSTURE: rounded shoulders, forward head, and increased thoracic kyphosis  PALPATION: TTP along the L T7-T12 thoracic paraspinals with multiple trigger points noted, increased tension noted along the L paraspinals  Hypomobility CPA of T7-T11, and hypomobility of L ribs T11-T9 with concordant symptoms noted with sprining.  LUMBAR ROM:   AROM eval  Flexion 88/ 80  Extension 20/ 30  Right lateral flexion 32 / 40  Left lateral flexion 30 / 40  Right rotation   Left rotation    (Blank rows = not tested)  Note: Lumbar / thoracic   LOWER EXTREMITY ROM:     Active  Right eval Left eval  Hip flexion    Hip extension    Hip abduction    Hip adduction    Hip internal rotation    Hip external rotation    Knee flexion    Knee extension    Ankle dorsiflexion    Ankle plantarflexion    Ankle inversion    Ankle eversion     (Blank rows = not tested)  LOWER EXTREMITY MMT:    MMT Right eval Left eval  Hip flexion    Hip extension    Hip abduction    Hip adduction    Hip internal rotation    Hip external rotation    Knee flexion    Knee  extension    Ankle dorsiflexion    Ankle  plantarflexion    Ankle inversion    Ankle eversion     (Blank rows = not tested)  LUMBAR SPECIAL TESTS:    FUNCTIONAL TESTS:    GAIT: Distance walked: from waiting area to treatment room Assistive device utilized: None Level of assistance: Complete Independence Comments: unremarkable.   TREATMENT:  [facility] Adult PT Treatment:                                                DATE: 10/22/2024 MTPR along the L lumbar paraspinals, tools that can assist with at home.  Thoracic T12-T7 grade V HVLA performed with cavitation noted L T11-T8 rib spring mobs grade III Cat/ cow 1 x 8  Quadruped threading the needle  bil 1 x 10 (reduced reach with using RUE rotating to the L KT Taping for inhibition Reviews seated posture and effect on thoracic/ lumbar spine especially in sitting Provided initial HEp.                                                                                                                                 PATIENT EDUCATION:  Education details: evaluation findings, POC, goals, HEP with proper form.  Person educated: Patient Education method: Explanation, Verbal cues, and Handouts Education comprehension: verbalized understanding  HOME EXERCISE PROGRAM: Access Code: [email or web address] URL: [email or web address] Date: 10/22/2024 Prepared by: Joneen Fresh  Program Notes using a door hold the door knob on both sides, set your feet and lean back holding 30-60 secs perform 3 x a day if possible.   Exercises - Cat Cow to Child's Pose  - 1 x daily - 7 x weekly - 2 sets - 10 reps - Child's Pose with Thread the Needle  - 1 x daily - 7 x weekly - 2 sets - 10 reps - Isometric Dead Bug  - 1 x daily - 7 x weekly - 2 sets - 10 reps - 30 sec hold - Seated Anterior Pelvic Tilt  - 1 x daily - 7 x weekly - 2 sets - 10 reps - 15-20 hold  ASSESSMENT:  CLINICAL IMPRESSION: Patient is a 43 y.o. M who was seen today for physical therapy evaluation and treatment for referral  dx of low back pain. Pain started in September of 2025 with no specific MOI noted. He demonstrates limited thoracic mobility and heavily relies on lumbar mobility. TTP located along the L mid thoracic paraspinals with hypomobility of T12-T7, and hypomobility of L Ribs with reproduction of concordant sx with L T11-T8. Following MTPR and thoracic mobility and manuipulation he noted relief of tension and demonstrated an improvement in thoracic AROM. He would benefit from physical therapy to increase thoracic AROM, reduce muscle tension, maximize postureal efficiency and overall function by  addressing the deficits listed.   OBJECTIVE IMPAIRMENTS: decreased activity tolerance, decreased ROM, increased fascial restrictions, impaired flexibility, postural dysfunction, and pain.   ACTIVITY LIMITATIONS: carrying, lifting, bending, and sitting  PARTICIPATION LIMITATIONS: community activity and occupation  PERSONAL FACTORS: Past/current experiences are also affecting patient's functional outcome.   REHAB POTENTIAL: Excellent  CLINICAL DECISION MAKING: Stable/uncomplicated  EVALUATION COMPLEXITY: Low   GOALS: Goals reviewed with patient? Yes  SHORT TERM GOALS: Target date: 12/03/2024  Pt to be IND with initial HEP for therapeutic progression Baseline: Goal status: INITIAL  2.  Pt to verbalize and demonstrate efficient trunk posture with sitting, standing and lifting activities to prevent and reduce pain Baseline:  Goal status: INITIAL  3.  Reduce muscle tension/ spas  to reduce pain to </= 3/10 at max and promote trunk mobility.  Baseline:  Goal status: INITIAL  LONG TERM GOALS: Target date: 01/14/2025   Increase Thoracic mobility in all planes to >/= 10 degrees with no report of pain or tension Baseline:  Goal status: INITIAL  2.  Rutha will be able to sit for >/= 60 min on varying surfaces with max pain of </= 2/10 pain.  Baseline:  Goal status: INITIAL  3.  Rutha will be able to  return to lifting heavy weights with no report of limitations or issues and report </=2/5 on the ODI demonstrating improvement Baseline:  Goal status: INITIAL  4.  Jimmy will increase is ODI score to </= 2/50 to demo improvement in function and QOL Baseline:  Goal status: INITIAL  5.  Pt to be IND with all HEP and is able to maintain and progress current LOF IND. Baseline:  Goal status: INITIAL  PLAN:  PT FREQUENCY: 1-2x/week  PT DURATION: 8 weeks  PLANNED INTERVENTIONS: 97110-Therapeutic exercises, 97530- Therapeutic activity, 97112- Neuromuscular re-education, 97535- Self Care, [id number]- Manual therapy, [phone]- Gait training, [phone]- Aquatic Therapy, [phone]- Electrical stimulation (unattended), [phone]- Traction (mechanical), [id number]- Ionotophoresis 4mg /ml Dexamethasone, Patient/Family education, Balance training, Stair training, Taping, Spinal manipulation, Spinal mobilization, Cryotherapy, and Moist heat.  PLAN FOR NEXT SESSION: review/ update HEP PRN. STW for L thoracic paraspinals, rib springing/ mobs, thoracic mobs, consider foam roll routine, core activation/ strengthening.   Zarai Orsborn PT, DPT, LAT, ATC  10/22/2024  12:43 PM      "

## 2024-11-08 ENCOUNTER — Ambulatory Visit: Admitting: Family Medicine

## 2024-11-08 ENCOUNTER — Encounter: Payer: Self-pay | Admitting: Family Medicine

## 2024-11-08 VITALS — BP 120/76 | HR 73 | Temp 98.5°F | Ht 67.0 in | Wt 144.0 lb

## 2024-11-08 DIAGNOSIS — Z0001 Encounter for general adult medical examination with abnormal findings: Secondary | ICD-10-CM | POA: Diagnosis not present

## 2024-11-08 DIAGNOSIS — E7800 Pure hypercholesterolemia, unspecified: Secondary | ICD-10-CM | POA: Diagnosis not present

## 2024-11-08 DIAGNOSIS — Z0000 Encounter for general adult medical examination without abnormal findings: Secondary | ICD-10-CM

## 2024-11-08 DIAGNOSIS — R1084 Generalized abdominal pain: Secondary | ICD-10-CM | POA: Diagnosis not present

## 2024-11-08 NOTE — Progress Notes (Signed)
 "  Subjective:    Patient ID: Carl Newton, male    DOB: 21-Apr-1981, 44 y.o.   MRN: [id number]  HPI  Patient is a 44 year old Caucasian gentleman who presents today for complete physical exam.  Blood pressure today is well-controlled at 120/76.  He has had his flu shot.  He is due for a tetanus shot.  He declines that today.  He has been dealing with pain in his back that radiates into his abdomen.  I believe he is getting muscle spasms in his abdomen related to his back pain.  He denies any weight loss or nausea or vomiting.  However he is concerned about the pain.  [facility] has recommended physical therapy.  His MRI of the lumbar spine was unremarkable.  Otherwise he is doing well with no concerns.  On physical exam today he does have an atypical mole in the center of his back roughly around the level of T7.  I took a photograph of the mole today and showed it to the patient.  I recommended that we monitor this.   Past Medical History:  Diagnosis Date   Atrial fibrillation [facility])    Urinary retention    No past surgical history on file.  No Known Allergies Social History   Socioeconomic History   Marital status: Married    Spouse name: Not on file   Number of children: Not on file   Years of education: Not on file   Highest education level: Doctorate  Occupational History   Not on file  Tobacco Use   Smoking status: Never   Smokeless tobacco: Never  Substance and Sexual Activity   Alcohol use: No   Drug use: No   Sexual activity: Not on file  Other Topics Concern   Not on file  Social History Narrative   Not on file   Social Drivers of Health   Tobacco Use: Low Risk (11/08/2024)   Patient History    Smoking Tobacco Use: Never    Smokeless Tobacco Use: Never    Passive Exposure: Not on file  Financial Resource Strain: Low Risk (05/13/2024)   Overall Financial Resource Strain (CARDIA)    Difficulty of Paying Living Expenses: Not hard at all  Food Insecurity:  No Food Insecurity (05/13/2024)   Epic    Worried About Programme Researcher, Broadcasting/film/video in the Last Year: Never true    Ran Out of Food in the Last Year: Never true  Transportation Needs: No Transportation Needs (05/13/2024)   Epic    Lack of Transportation (Medical): No    Lack of Transportation (Non-Medical): No  Physical Activity: Insufficiently Active (05/13/2024)   Exercise Vital Sign    Days of Exercise per Week: 2 days    Minutes of Exercise per Session: 20 min  Stress: No Stress Concern Present (05/13/2024)   [employer] of Occupational Health - Occupational Stress Questionnaire    Feeling of Stress: Only a little  Social Connections: Socially Integrated (05/13/2024)   Social Connection and Isolation Panel    Frequency of Communication with Friends and Family: Three times a week    Frequency of Social Gatherings with Friends and Family: Once a week    Attends Religious Services: More than 4 times per year    Active Member of [employer] or Organizations: Yes    Attends Engineer, Structural: More than 4 times per year    Marital Status: Married  Catering Manager Violence: Not on file  Depression (PHQ2-9): Low  Risk (11/07/2023)   Depression (PHQ2-9)    PHQ-2 Score: 0  Alcohol Screen: Low Risk (05/13/2024)   Alcohol Screen    Last Alcohol Screening Score (AUDIT): 1  Housing: Unknown (05/13/2024)   Epic    Unable to Pay for Housing in the Last Year: No    Number of Times Moved in the Last Year: Not on file    Homeless in the Last Year: No  Utilities: Not on file  Health Literacy: Not on file   Patient is married with 3 children.  He works as a teacher, early years/pre. Family History  Problem Relation Age of Onset   Cancer Father        prostate   Alzheimer's disease Father    Heart disease Maternal Uncle    Heart disease Maternal Grandmother    Heart disease Maternal Grandfather    Alzheimer's disease Paternal Aunt    Alzheimer's disease Paternal Uncle      Review of Systems  All  other systems reviewed and are negative.      Objective:   Physical Exam Vitals reviewed.  Constitutional:      General: He is not in acute distress.    Appearance: He is well-developed. He is not diaphoretic.  HENT:     Head: Normocephalic and atraumatic.     Right Ear: Tympanic membrane, ear canal and external ear normal.     Left Ear: Tympanic membrane, ear canal and external ear normal.     Nose: Nose normal. No congestion or rhinorrhea.     Mouth/Throat:     Pharynx: No oropharyngeal exudate.  Eyes:     General: No scleral icterus.       Right eye: No discharge.        Left eye: No discharge.     Extraocular Movements: Extraocular movements intact.     Conjunctiva/sclera: Conjunctivae normal.     Pupils: Pupils are equal, round, and reactive to light.  Neck:     Thyroid: No thyromegaly.     Vascular: No JVD.     Trachea: No tracheal deviation.  Cardiovascular:     Rate and Rhythm: Normal rate and regular rhythm.     Heart sounds: Normal heart sounds. No murmur heard.    No friction rub. No gallop.  Pulmonary:     Effort: Pulmonary effort is normal. No respiratory distress.     Breath sounds: Normal breath sounds. No stridor. No wheezing or rales.  Chest:     Chest wall: No tenderness.  Abdominal:     General: Bowel sounds are normal. There is no distension.     Palpations: Abdomen is soft. There is no mass.     Tenderness: There is no abdominal tenderness. There is no guarding or rebound.  Genitourinary:    Penis: Normal.      Testes: Normal.  Musculoskeletal:        General: No tenderness or deformity. Normal range of motion.     Cervical back: Normal range of motion and neck supple.  Lymphadenopathy:     Cervical: No cervical adenopathy.  Skin:    General: Skin is warm.     Coloration: Skin is not pale.     Findings: No erythema or rash.  Neurological:     General: No focal deficit present.     Mental Status: He is alert and oriented to person, place, and  time. Mental status is at baseline.     Cranial Nerves: No cranial nerve deficit.  Sensory: No sensory deficit.     Motor: No weakness or abnormal muscle tone.     Coordination: Coordination normal.     Gait: Gait normal.     Deep Tendon Reflexes: Reflexes are normal and symmetric. Reflexes normal.  Psychiatric:        Behavior: Behavior normal.        Thought Content: Thought content normal.        Judgment: Judgment normal.           Assessment & Plan:  General medical exam - Plan: CBC with Differential/Platelet, Comprehensive metabolic panel with GFR, Lipid panel  Pure hypercholesterolemia - Plan: CBC with Differential/Platelet, Comprehensive metabolic panel with GFR, Lipid panel  Generalized abdominal pain - Plan: Lipase I believe the abdominal pain that is radiating from his back into his abdomen is most likely muscle spasms.  I believe he is having cramps in his rectus muscles triggered by back spasms.  However given that the pancreatitis and pancreas issues can cause pain in the back and abdomen I will check a lipase.  I will check a CBC a CMP and a lipid panel as part of his physical exam.  I recommended a tetanus shot.  We have decided to clinically monitor the mole.  I believe that this is just an atypical mole with irregular borders.  We will monitor this moving forward.  He declined a referral to dermatology "

## 2024-11-11 ENCOUNTER — Other Ambulatory Visit

## 2024-11-12 ENCOUNTER — Encounter: Payer: Self-pay | Admitting: Physical Therapy

## 2024-11-12 ENCOUNTER — Ambulatory Visit: Attending: Specialist | Admitting: Physical Therapy

## 2024-11-12 DIAGNOSIS — R252 Cramp and spasm: Secondary | ICD-10-CM | POA: Insufficient documentation

## 2024-11-12 DIAGNOSIS — R278 Other lack of coordination: Secondary | ICD-10-CM | POA: Diagnosis present

## 2024-11-12 DIAGNOSIS — M5459 Other low back pain: Secondary | ICD-10-CM | POA: Diagnosis present

## 2024-11-12 DIAGNOSIS — M62830 Muscle spasm of back: Secondary | ICD-10-CM | POA: Diagnosis present

## 2024-11-12 NOTE — Therapy (Signed)
 " OUTPATIENT PHYSICAL THERAPY TREATMENT   Patient Name: Carl Newton MRN: [id number] DOB:04/11/1981, 44 y.o., male Today's Date: 11/12/2024  END OF SESSION:  PT End of Session - 11/12/24 1110     Visit Number 2    Number of Visits 9    Date for Recertification  01/14/25    Authorization Type [employer]    PT Start Time 1110    PT Stop Time 1151    PT Time Calculation (min) 41 min    Activity Tolerance Patient tolerated treatment well    Behavior During Therapy WFL for tasks assessed/performed           Past Medical History:  Diagnosis Date   Atrial fibrillation (HCC)    Urinary retention    History reviewed. No pertinent surgical history. Patient Active Problem List   Diagnosis Date Noted   Lumbar spine pain 09/22/2024   Lumbar radiculopathy 06/18/2023   Paroxysmal atrial fibrillation (HCC) 06/29/2021    PCP: Duanne Butler DASEN, MD   REFERRING PROVIDER:  Duwayne Purchase, MD   REFERRING DIAG: Low back pain, unspecified [M54.50]   Rationale for Evaluation and Treatment: Rehabilitation  THERAPY DIAG:  Other low back pain  Muscle spasm of back  Cramp and spasm  Other lack of coordination  ONSET DATE: Labor day 2025  SUBJECTIVE:                                                                                                                                                                                           SUBJECTIVE STATEMENT: 11/12/2024 Overall I feel its a little improved. The episodes aren't as bad/ frequent. I have been working on posture in sitting and being more aware of my posture in general.  EVAL: Pt arrives noting pain in the mid to upper back, that started back around labor day of 2025 with no specific MOI, but reports potential cause could be that he had a drive for 3 hours. He denies any LE referral, no UE referral. He reports since onset the symptoms fluctuate depending on the position and laying at night on the Left.   PERTINENT  HISTORY:  See Pmhx  PAIN:  Are you having pain? Yes: NPRS scale:  Pain location: mid upper back  Pain description: Pressure, dull aching  Aggravating factors: Left sidelying,  sitting in slouched position, excessive tw Relieving factors: self- popping the back. Childs pose stretch.   PRECAUTIONS: None  RED FLAGS: None   WEIGHT BEARING RESTRICTIONS: No  FALLS:  Has patient fallen in last 6 months? No  LIVING ENVIRONMENT: Lives with: lives with their family   OCCUPATION: IT  PLOF: Independent  PATIENT GOALS: to decrease pain   OBJECTIVE:  Note: Objective measures were completed at Evaluation unless otherwise noted.  DIAGNOSTIC FINDINGS:  MRI   PATIENT SURVEYS:  Modified Oswestry:  MODIFIED OSWESTRY DISABILITY SCALE   Date: 10/22/2024  Pain intensity 4 =  Pain medication provides me with little relief from pain.  2. Personal care (washing, dressing, etc.) 0 =  I can take care of myself normally without causing increased pain.  3. Lifting 3 = Pain prevents me from lifting heavy weights, but I can manage light to medium weights if they are conveniently positioned  4. Walking 0 = Pain does not prevent me from walking any distance  5. Sitting 2 =  Pain prevents me from sitting more than 1 hour.  6. Standing 0 =  I can stand as long as I want without increased pain.  7. Sleeping 0 = Pain does not prevent me from sleeping well.  8. Social Life 0 = My social life is normal and does not increase my pain.  9. Traveling 1 =  I can travel anywhere, but it increases my pain.  10. Employment/ Homemaking 0 = My normal homemaking/job activities do not cause pain.  Total 10/50   Interpretation of scores: Score Category Description  0-20% Minimal Disability The patient can cope with most living activities. Usually no treatment is indicated apart from advice on lifting, sitting and exercise  21-40% Moderate Disability The patient experiences more pain and difficulty with sitting,  lifting and standing. Travel and social life are more difficult and they may be disabled from work. Personal care, sexual activity and sleeping are not grossly affected, and the patient can usually be managed by conservative means  41-60% Severe Disability Pain remains the main problem in this group, but activities of daily living are affected. These patients require a detailed investigation  61-80% Crippled Back pain impinges on all aspects of the patients life. Positive intervention is required  81-100% Bed-bound These patients are either bed-bound or exaggerating their symptoms  Bluford FORBES Zoe DELENA Karon DELENA, et al. Surgery versus conservative management of stable thoracolumbar fracture: the PRESTO feasibility RCT. [place] ([place]): [employer]; 2021 Nov. [facility] Technology Assessment, No. 25.62.) Appendix 3, Oswestry Disability Index category descriptors. Available from: Findjewelers.cz  Minimally Clinically Important Difference (MCID) = 12.8%  COGNITION: Overall cognitive status: Within functional limits for tasks assessed       POSTURE: rounded shoulders, forward head, and increased thoracic kyphosis  PALPATION: TTP along the L T7-T12 thoracic paraspinals with multiple trigger points noted, increased tension noted along the L paraspinals  Hypomobility CPA of T7-T11, and hypomobility of L ribs T11-T9 with concordant symptoms noted with sprining.  LUMBAR ROM:   AROM eval  Flexion 88/ 80  Extension 20/ 30  Right lateral flexion 32 / 40  Left lateral flexion 30 / 40  Right rotation   Left rotation    (Blank rows = not tested)  Note: Lumbar / thoracic   LOWER EXTREMITY ROM:     Active  Right eval Left eval  Hip flexion    Hip extension    Hip abduction    Hip adduction    Hip internal rotation    Hip external rotation    Knee flexion    Knee extension    Ankle dorsiflexion    Ankle plantarflexion    Ankle inversion    Ankle  eversion     (Blank rows = not tested)  LOWER EXTREMITY MMT:    MMT Right eval Left eval  Hip flexion    Hip extension    Hip abduction    Hip adduction    Hip internal rotation    Hip external rotation    Knee flexion    Knee extension    Ankle dorsiflexion    Ankle plantarflexion    Ankle inversion    Ankle eversion     (Blank rows = not tested)  LUMBAR SPECIAL TESTS:    FUNCTIONAL TESTS:    GAIT: Distance walked: from waiting area to treatment room Assistive device utilized: None Level of assistance: Complete Independence Comments: unremarkable.   TREATMENT:  [facility] Adult PT Treatment:                                                DATE: 11/12/2024 Hamstring stretch 2 x 30 sec PNG contract/ relax L T8-T11 Rib mobs with cavitation noted at T10 MTPR along the L thoracic paraspinals.  Thoracic central mobs Grade IV T8-T11 Foam roll routing 1 x 15 of ea. Ceiling punches, horizontal abd/adduction, alternating ceiling punches, X to Y, and back stroke. Supine while laying on foam roller, horizontal abduction and 2 x diagonal abd 1 x 15 ea. Reviewed and updated HEP   [facility] Adult PT Treatment:                                                DATE: 10/22/2024 MTPR along the L lumbar paraspinals, tools that can assist with at home.  Thoracic T12-T7 grade V HVLA performed with cavitation noted L T11-T8 rib spring mobs grade III Cat/ cow 1 x 8  Quadruped threading the needle  bil 1 x 10 (reduced reach with using RUE rotating to the L KT Taping for inhibition Reviews seated posture and effect on thoracic/ lumbar spine especially in sitting Provided initial HEp.                                                                                                                                 PATIENT EDUCATION:  Education details: evaluation findings, POC, goals, HEP with proper form.  Person educated: Patient Education method: Explanation, Verbal cues, and Handouts Education  comprehension: verbalized understanding  HOME EXERCISE PROGRAM: Access Code: [email or web address] URL: [email or web address] Date: 11/12/2024 Prepared by: Joneen Fresh  Program Notes using a door hold the door knob on both sides, set your feet and lean back holding 30-60 secs perform 3 x a day if possible.   Exercises - Cat Cow to Child's Pose  - 1 x daily - 7 x weekly - 2 sets - 10 reps - Child's Pose with Thread  the Needle  - 1 x daily - 7 x weekly - 2 sets - 10 reps - Isometric Dead Bug  - 1 x daily - 7 x weekly - 2 sets - 10 reps - 30 sec hold - Seated Anterior Pelvic Tilt  - 1 x daily - 7 x weekly - 2 sets - 10 reps - 15-20 hold - Seated Hamstring Stretch  - 1 x daily - 7 x weekly - 2 sets - 2 reps - 30 hold - Supine Hamstring Stretch with Strap  - 1 x daily - 7 x weekly - 2 sets - 2 reps - 30 hold - Standing Shoulder Diagonal Horizontal Abduction 60/120 Degrees with Resistance  - 1 x daily - 7 x weekly - 3-4 sets - 15-20 reps - Standing Shoulder Horizontal Abduction with Resistance  - 1 x daily - 7 x weekly - 3-4 sets - 15-20 reps - Supine on Foam Roll Reach and Roll  - 1 x daily - 7 x weekly - 10 reps - 5 seconds hold - [place] on Foam Roll  - 1 x daily - 7 x weekly - 10 reps - Thoracic Y on Foam Roll  - 1 x daily - 7 x weekly - 1 reps - 20-30 seconds hold - Supine Static Chest Stretch on Foam Roll  - 1 x daily - 7 x weekly - 1 reps - 20-30 seconds hold - Hooklying Scapular Protraction on Foam Roll  - 1 x daily - 7 x weekly - 10 reps  ASSESSMENT:  CLINICAL IMPRESSION: 1/13/2026Jimmy arrives to session since his evaluation reporting overall improvement in pain and reduction in frequency, and has been compliant with his HEP and postural awareness. Focused on continued mobilization for lower thoracic spine and L costovertebral facets which cavitation was noted. He did well thoracic stablization with scapular mobility, and posterior chain activation. End of session he noted  pain had dropped from a 3/10 to a 0/10. Plan to assess next visit if he continues to do well potentially dropping to 1 x every other week.  EVAL: Patient is a 44 y.o. M who was seen today for physical therapy evaluation and treatment for referral dx of low back pain. Pain started in September of 2025 with no specific MOI noted. He demonstrates limited thoracic mobility and heavily relies on lumbar mobility. TTP located along the L mid thoracic paraspinals with hypomobility of T12-T7, and hypomobility of L Ribs with reproduction of concordant sx with L T11-T8. Following MTPR and thoracic mobility and manuipulation he noted relief of tension and demonstrated an improvement in thoracic AROM. He would benefit from physical therapy to increase thoracic AROM, reduce muscle tension, maximize postureal efficiency and overall function by addressing the deficits listed.   OBJECTIVE IMPAIRMENTS: decreased activity tolerance, decreased ROM, increased fascial restrictions, impaired flexibility, postural dysfunction, and pain.   ACTIVITY LIMITATIONS: carrying, lifting, bending, and sitting  PARTICIPATION LIMITATIONS: community activity and occupation  PERSONAL FACTORS: Past/current experiences are also affecting patient's functional outcome.   REHAB POTENTIAL: Excellent  CLINICAL DECISION MAKING: Stable/uncomplicated  EVALUATION COMPLEXITY: Low   GOALS: Goals reviewed with patient? Yes  SHORT TERM GOALS: Target date: 12/03/2024  Pt to be IND with initial HEP for therapeutic progression Baseline: Goal status: INITIAL  2.  Pt to verbalize and demonstrate efficient trunk posture with sitting, standing and lifting activities to prevent and reduce pain Baseline:  Goal status: INITIAL  3.  Reduce muscle tension/ spas  to reduce pain to </=  3/10 at max and promote trunk mobility.  Baseline:  Goal status: INITIAL  LONG TERM GOALS: Target date: 01/14/2025   Increase Thoracic mobility in all planes to  >/= 10 degrees with no report of pain or tension Baseline:  Goal status: INITIAL  2.  Rutha will be able to sit for >/= 60 min on varying surfaces with max pain of </= 2/10 pain.  Baseline:  Goal status: INITIAL  3.  Rutha will be able to return to lifting heavy weights with no report of limitations or issues and report </=2/5 on the ODI demonstrating improvement Baseline:  Goal status: INITIAL  4.  Jimmy will increase is ODI score to </= 2/50 to demo improvement in function and QOL Baseline:  Goal status: INITIAL  5.  Pt to be IND with all HEP and is able to maintain and progress current LOF IND. Baseline:  Goal status: INITIAL  PLAN:  PT FREQUENCY: 1-2x/week  PT DURATION: 8 weeks  PLANNED INTERVENTIONS: 97110-Therapeutic exercises, 97530- Therapeutic activity, 97112- Neuromuscular re-education, 97535- Self Care, [id number]- Manual therapy, [phone]- Gait training, [phone]- Aquatic Therapy, [phone]- Electrical stimulation (unattended), [phone]- Traction (mechanical), [id number]- Ionotophoresis 4mg /ml Dexamethasone, Patient/Family education, Balance training, Stair training, Taping, Spinal manipulation, Spinal mobilization, Cryotherapy, and Moist heat.  PLAN FOR NEXT SESSION: review/ update HEP PRN. STW for L thoracic paraspinals, rib springing/ mobs, thoracic mobs, consider foam roll routine, core activation/ strengthening with focus on posterior chain and pec stretching.    Misk Galentine PT, DPT, LAT, ATC  11/12/2024  1:15 PM      "

## 2024-11-15 ENCOUNTER — Other Ambulatory Visit

## 2024-11-15 ENCOUNTER — Other Ambulatory Visit: Payer: Self-pay

## 2024-11-16 LAB — COMPREHENSIVE METABOLIC PANEL WITH GFR
AG Ratio: 1.5 (calc) (ref 1.0–2.5)
ALT: 21 U/L (ref 9–46)
AST: 24 U/L (ref 10–40)
Albumin: 4.6 g/dL (ref 3.6–5.1)
Alkaline phosphatase (APISO): 67 U/L (ref 36–130)
BUN: 18 mg/dL (ref 7–25)
CO2: 29 mmol/L (ref 20–32)
Calcium: 9.4 mg/dL (ref 8.6–10.3)
Chloride: 102 mmol/L (ref 98–110)
Creat: 1.13 mg/dL (ref 0.60–1.29)
Globulin: 3.1 g/dL (ref 1.9–3.7)
Glucose, Bld: 89 mg/dL (ref 65–99)
Potassium: 4.7 mmol/L (ref 3.5–5.3)
Sodium: 138 mmol/L (ref 135–146)
Total Bilirubin: 0.7 mg/dL (ref 0.2–1.2)
Total Protein: 7.7 g/dL (ref 6.1–8.1)
eGFR: 83 mL/min/1.73m2

## 2024-11-16 LAB — LIPID PANEL
Cholesterol: 237 mg/dL — ABNORMAL HIGH
HDL: 59 mg/dL
LDL Cholesterol (Calc): 160 mg/dL — ABNORMAL HIGH
Non-HDL Cholesterol (Calc): 178 mg/dL — ABNORMAL HIGH
Total CHOL/HDL Ratio: 4 (calc)
Triglycerides: 78 mg/dL

## 2024-11-16 LAB — CBC WITH DIFFERENTIAL/PLATELET
Absolute Lymphocytes: 2484 {cells}/uL (ref 850–3900)
Absolute Monocytes: 464 {cells}/uL (ref 200–950)
Basophils Absolute: 32 {cells}/uL (ref 0–200)
Basophils Relative: 0.6 %
Eosinophils Absolute: 119 {cells}/uL (ref 15–500)
Eosinophils Relative: 2.2 %
HCT: 44.4 % (ref 39.4–51.1)
Hemoglobin: 15 g/dL (ref 13.2–17.1)
MCH: 30.3 pg (ref 27.0–33.0)
MCHC: 33.8 g/dL (ref 31.6–35.4)
MCV: 89.7 fL (ref 81.4–101.7)
MPV: 9.8 fL (ref 7.5–12.5)
Monocytes Relative: 8.6 %
Neutro Abs: 2300 {cells}/uL (ref 1500–7800)
Neutrophils Relative %: 42.6 %
Platelets: 269 Thousand/uL (ref 140–400)
RBC: 4.95 Million/uL (ref 4.20–5.80)
RDW: 12.1 % (ref 11.0–15.0)
Total Lymphocyte: 46 %
WBC: 5.4 Thousand/uL (ref 3.8–10.8)

## 2024-11-16 LAB — LIPASE: Lipase: 27 U/L (ref 7–60)

## 2024-11-18 ENCOUNTER — Ambulatory Visit: Payer: Self-pay | Admitting: Family Medicine

## 2024-11-19 ENCOUNTER — Ambulatory Visit: Admitting: Physical Therapy

## 2024-11-26 ENCOUNTER — Ambulatory Visit: Admitting: Physical Therapy

## 2024-12-03 ENCOUNTER — Ambulatory Visit: Admitting: Physical Therapy

## 2024-12-03 DIAGNOSIS — M5459 Other low back pain: Secondary | ICD-10-CM

## 2024-12-03 DIAGNOSIS — M62830 Muscle spasm of back: Secondary | ICD-10-CM

## 2024-12-03 DIAGNOSIS — R252 Cramp and spasm: Secondary | ICD-10-CM

## 2024-12-17 ENCOUNTER — Ambulatory Visit: Admitting: Physical Therapy
